# Patient Record
Sex: Male | Born: 1955 | Hispanic: Yes | Marital: Married | Smoking: Former smoker
Health system: Southern US, Community
[De-identification: ages and names within clinical notes are randomized; demographics above are authoritative.]

## PROBLEM LIST (undated history)

## (undated) DIAGNOSIS — E785 Hyperlipidemia, unspecified: Secondary | ICD-10-CM

## (undated) DIAGNOSIS — C801 Malignant (primary) neoplasm, unspecified: Secondary | ICD-10-CM

## (undated) DIAGNOSIS — R1900 Intra-abdominal and pelvic swelling, mass and lump, unspecified site: Secondary | ICD-10-CM

## (undated) DIAGNOSIS — R109 Unspecified abdominal pain: Secondary | ICD-10-CM

## (undated) DIAGNOSIS — Z8719 Personal history of other diseases of the digestive system: Secondary | ICD-10-CM

## (undated) HISTORY — PX: OTHER SURGICAL HISTORY: SHX169

## (undated) HISTORY — DX: Personal history of other diseases of the digestive system: Z87.19

## (undated) HISTORY — DX: Hyperlipidemia, unspecified: E78.5

## (undated) HISTORY — DX: Intra-abdominal and pelvic swelling, mass and lump, unspecified site: R19.00

## (undated) HISTORY — DX: Unspecified abdominal pain: R10.9

---

## 2000-03-24 ENCOUNTER — Emergency Department (HOSPITAL_COMMUNITY): Admission: EM | Admit: 2000-03-24 | Discharge: 2000-03-24 | Payer: Self-pay | Admitting: *Deleted

## 2002-10-25 ENCOUNTER — Encounter: Admission: RE | Admit: 2002-10-25 | Discharge: 2002-10-25 | Payer: Self-pay | Admitting: Internal Medicine

## 2004-06-24 ENCOUNTER — Emergency Department (HOSPITAL_COMMUNITY): Admission: EM | Admit: 2004-06-24 | Discharge: 2004-06-25 | Payer: Self-pay | Admitting: Emergency Medicine

## 2004-06-26 ENCOUNTER — Emergency Department (HOSPITAL_COMMUNITY): Admission: EM | Admit: 2004-06-26 | Discharge: 2004-06-26 | Payer: Self-pay | Admitting: Emergency Medicine

## 2008-04-24 ENCOUNTER — Inpatient Hospital Stay (HOSPITAL_COMMUNITY): Admission: EM | Admit: 2008-04-24 | Discharge: 2008-04-25 | Payer: Self-pay | Admitting: Emergency Medicine

## 2011-01-14 NOTE — Consult Note (Signed)
NAMEDALBERT, STILLINGS NO.:  [id number]   MEDICAL RECORD NO.:  [id number]          PATIENT TYPE:  INP   LOCATION:  4703                         FACILITY:  [facility]   PHYSICIAN:  Georga Hacking, M.D.DATE OF BIRTH:  December 28, 1955   DATE OF CONSULTATION:  04/24/2008  DATE OF DISCHARGE:                                 CONSULTATION   I was asked to see this 55 year old male for unassigned cardiology call.  The patient has been here 11 years ago from [place] and is probably  illegal at the present time.  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  He presented to the emergency room last evening with a prolonged episode  of chest discomfort.  He described the chest discomfort as having around  7:30 in the evening and lasting around 30 minutes.  Describes lightness  and shortness of breath associated with these symptoms.  He had a  previous episode where he felt as if he might have blacked out because  of some discomfort.  He does not have exertional type chest pain.  He is  currently pain free and has had a negative EKG and negative  cardiovascular enzymes.  He denies PND, orthopnea or edema, but does  smoke 1/2-1 pack of cigarettes per day.   PAST MEDICAL HISTORY:  Negative for hypertension, diabetes, or previous  stomach trouble.  He has a mild history of hyperlipidemia.   ALLERGIES:  None.   PAST SURGICAL HISTORY:  None.   MEDICATIONS:  None prior to admission.   SOCIAL HISTORY:  He smokes cigarettes.  He is married and has several  children.  He is evidently here legally from [place].  He does not have a  visa currently.  He says he does not use drugs to excess.   REVIEW OF SYSTEMS:  Negative except as noted above.   PHYSICAL EXAMINATION:  GENERAL:  He is a Hispanic male in no acute  distress.  VITAL SIGNS:  His blood pressure is currently 111/63, oxygen saturations  are 93%.  SKIN:  Warm and dry.  ENT:  EOMI.  PERRLA.  CNS clear.  Fundi not examined.  Pharynx  negative.  NECK:  Supple.  No masses, JVD, thyromegaly or bruits.  LUNGS:  Clear to A&P.  CARDIAC:  Normal S1-S2.  No S3 or murmur.  ABDOMEN:  Soft, nontender.  Femoral distal pulses 2+.   EKG was normal.  Two-view chest x-ray shows low volumes with basilar  atelectasis.   IMPRESSION:  1. Prolonged chest discomfort with myocardial infarction ruled out.  2. Risk factors of low HDL, cholesterol of 27, and smoking.   RECOMMENDATIONS:  Cardiolite stress testing.  He has been on beta  blockers which have been stopped.  I would stop his oxygen at this time.  Continue aspirin.      Georga Hacking, M.D.  Electronically Signed     WST/MEDQ  D:  04/24/2008  T:  04/25/2008  Job:  [id number]   cc:   Renee Ramus, MD

## 2011-01-14 NOTE — H&P (Signed)
Jesse Frye, Jesse NO.:  [id number]   MEDICAL RECORD NO.:  [id number]          PATIENT TYPE:  INP   LOCATION:  4703                         FACILITY:  [facility]   PHYSICIAN:  Della Goo, M.D. DATE OF BIRTH:  1955-12-23   DATE OF ADMISSION:  04/24/2008  DATE OF DISCHARGE:                              HISTORY & PHYSICAL   PRIMARY CARE PHYSICIAN:  Unassigned.   CHIEF COMPLAINT:  Chest pain.   HISTORY OF PRESENT ILLNESS:  This is a 55 year old male presenting to  the emergency department with complaints of left-sided chest pain, that  started at about 7:30 p.m. and lasted about 30 minutes.  The patient  reports having lightheadedness and shortness of breath associated with  his symptoms.  He denies having any nausea, vomiting or diaphoresis  associated.  The patient does have a reported history of hyperlipidemia.  He is currently on no medications at this time.  The patient denies  having any previous similar episodes to this and denies having any  family history.  The patient is a smoker and reports smoking a half-pack  of cigarettes daily.  The patient denies having any fevers, chills,  cough, chest congestion, syncope or diarrheal symptoms.   PAST MEDICAL HISTORY:  Hyperlipidemia.   MEDICATIONS:  None.   PAST SURGICAL HISTORY:  None.   ALLERGIES:  NO KNOWN DRUG ALLERGIES.   SOCIAL HISTORY:  The patient is married.  He is a smoker.  He denies  alcohol usage and denies illicit drug usage.   FAMILY HISTORY:  Negative for coronary artery disease.   REVIEW OF SYSTEMS:  Pertinents are mentioned above.   PHYSICAL EXAMINATION FINDINGS:  This is a 55 year old well-nourished,  well-developed male in no discomfort or acute distress currently.  VITAL SIGNS:  Temperature 97.9, blood pressure 149/82, heart rate 80,  respirations 18, O2 saturations 99% on room air.  HEENT:  Examination normocephalic, atraumatic.  There is no scleral  icterus.  Pupils are  equally round and reactive to light.  Extraocular  movements are intact.  Funduscopic benign.  Oropharynx clear.  NECK:  Supple; full range of motion.  No thyromegaly, adenopathy or  jugular venous distention.  CARDIOVASCULAR:  Regular rate and rhythm.  No murmurs, gallops or rubs.  LUNGS:  Clear to auscultation bilaterally.  ABDOMEN:  Positive bowel sounds; soft, nontender and nondistended.  EXTREMITIES:  Without cyanosis, clubbing or edema.  NEUROLOGIC EXAMINATION:  Nonfocal.   LABORATORY STUDIES:  Chemistry:  Sodium 138, potassium 4.0, chloride  107, bicarb 24, BUN 10, creatinine 1.0, glucose 118, ionized calcium  1.16.  Cardiac enzymes:  Myoglobin 96.9, CK-MB 3.0, troponin less than  0.05.   CHEST X-RAY FINDINGS:  Reveal low lung volumes and bibasilar  atelectasis.   EKG:  Reveals a normal sinus rhythm without acute ST-segment changes.   ASSESSMENT:  A 55 year old male being admitted with:  1. Chest pain.  2. Reported history of hyperlipidemia.  3. Tobacco abuse.   PLAN:  The patient will be admitted to a telemetry area for cardiac  monitoring.  Cardiac enzymes will be  performed.  The patient will be  placed on nitrates, oxygen, beta blocker therapy at low-dose, and DVT  and GI prophylaxis.  The patient will also be placed on a nicotine patch  while hospitalized.  Further workup will ensue, pending results of the  patient's studies and his clinical course.  A fasting lipid panel will  be ordered in the a.m.      Della Goo, M.D.  Electronically Signed     HJ/MEDQ  D:  04/24/2008  T:  04/24/2008  Job:  [id number]

## 2011-01-14 NOTE — Discharge Summary (Signed)
NAMEIDRIS, EDMUNDSON NO.:  [id number]   MEDICAL RECORD NO.:  [id number]          PATIENT TYPE:  INP   LOCATION:  4703                         FACILITY:  [facility]   PHYSICIAN:  Renee Ramus, MD       DATE OF BIRTH:  [birth year]   DATE OF ADMISSION:  04/23/2008  DATE OF DISCHARGE:  04/25/2008                               DISCHARGE SUMMARY   ADDENDUM   The patient was seen by Cardiology who felt he would benefit from a  nuclear stress test.  The patient was kept overnight.  He received his  stress test today.  His test was also completely normal with no evidence  of any type of coronary artery disease, reperfusion injury, or ischemia.  The patient is now being discharged to home.  There are no changes in  medications or additional labs.   TIME SPENT:  35 minutes.      Renee Ramus, MD  Electronically Signed     JF/MEDQ  D:  04/25/2008  T:  04/26/2008  Job:  [id number]

## 2011-01-14 NOTE — Discharge Summary (Signed)
NAMEJAEVIAN, SHEAN NO.:  [id number]   MEDICAL RECORD NO.:  [id number]           PATIENT TYPE:   LOCATION:                                 FACILITY:   PHYSICIAN:  Renee Ramus, MD       DATE OF BIRTH:  09/09/1955   DATE OF ADMISSION:  DATE OF DISCHARGE:                               DISCHARGE SUMMARY   PRIMARY DIAGNOSIS:  Chest pain.   SECONDARY DIAGNOSIS:  Hyperlipidemia.   HOSPITAL COURSE BY PROBLEM:  1. Chest pain.  The patient is a 55 year old Hispanic male who was      admitted secondary to, predominantly, shortness of breath with      lightheadedness.  The patient denies any specific pain.  He does      report a little pressure in his chest and was admitted for      further evaluation and treatment.  The patient does have history of      high cholesterol but is currently on no medications.  The patient      does have a primary care physician.  The patient's cardiac risk      factors include male, age 63, with tobacco history.  Other than      that, he has no cardiac risk factors.  The patient had negative CK      and troponins x2 along with negative EKGs and negative chest x-ray.      The patient was thought not be suffering from acute coronary      syndrome, discussed with Cardiology briefly.  The patient will be      discharged with instructions to follow up with [facility] Cardiology for      stress test if needed.  He does not require cholesterol medication      currently given his lipid profile, and we have counseled him to      quit smoking.  The patient will return to the emergency department      if his symptoms recur for admission for stress testing.  The      patient understands that we are making a best possible guess at      this diagnosis given lack of risk stratification and confirmatory      testing; however, he believes this is not cardiac as well and      wishes to be discharged.  2. Hyperlipidemia.  The patient is currently not requiring  medication      per NCEP guidelines, and he will be discharged to home with      instructions to follow up with his primary care physician as      needed.   LABORATORIES OF NOTE:  1. Troponin I x2 negative, with the first being 0.05 and the second      one being less than 0.01.  2. CK of 208 with an MB fraction of 3.4.  3. Cholesterol panel with total cholesterol 166, LDL 106, and HDL of      27.   STUDIES:  1. EKG x2 showing no acute disease.  2.  Chest x-ray showing low lung volumes but no acute disease.  3. CT of the head showing normal exam.  4. Followup chest x-ray showing no acute disease.   MEDICATIONS ON DISCHARGE:  None.   There are no labs or studies pending at the time of discharge.  The  patient is in stable condition and anxious for discharge.   TIME SPENT:  35 minutes.      Renee Ramus, MD  Electronically Signed     JF/MEDQ  D:  04/24/2008  T:  04/25/2008  Job:  [id number]

## 2013-08-15 ENCOUNTER — Encounter (HOSPITAL_COMMUNITY): Payer: Self-pay | Admitting: Emergency Medicine

## 2013-08-15 ENCOUNTER — Emergency Department (HOSPITAL_COMMUNITY)
Admission: EM | Admit: 2013-08-15 | Discharge: 2013-08-15 | Disposition: A | Discharge status: Home / Self Care | Payer: Self-pay | Attending: Emergency Medicine | Admitting: Emergency Medicine

## 2013-08-15 DIAGNOSIS — R112 Nausea with vomiting, unspecified: Secondary | ICD-10-CM | POA: Insufficient documentation

## 2013-08-15 DIAGNOSIS — Z880 Allergy status to penicillin: Secondary | ICD-10-CM | POA: Insufficient documentation

## 2013-08-15 DIAGNOSIS — F17200 Nicotine dependence, unspecified, uncomplicated: Secondary | ICD-10-CM | POA: Insufficient documentation

## 2013-08-15 DIAGNOSIS — R110 Nausea: Secondary | ICD-10-CM

## 2013-08-15 LAB — CBC WITH DIFFERENTIAL/PLATELET
Basophils Absolute: 0 10*3/uL (ref 0.0–0.1)
Basophils Relative: 1 % (ref 0–1)
Eosinophils Absolute: 0.2 10*3/uL (ref 0.0–0.7)
Eosinophils Relative: 3 % (ref 0–5)
HCT: 38.3 % — ABNORMAL LOW (ref 39.0–52.0)
Hemoglobin: 13.2 g/dL (ref 13.0–17.0)
Lymphocytes Relative: 52 % — ABNORMAL HIGH (ref 12–46)
Lymphs Abs: 3.8 10*3/uL (ref 0.7–4.0)
MCH: 32.2 pg (ref 26.0–34.0)
MCHC: 34.5 g/dL (ref 30.0–36.0)
MCV: 93.4 fL (ref 78.0–100.0)
Monocytes Absolute: 0.9 10*3/uL (ref 0.1–1.0)
Monocytes Relative: 13 % — ABNORMAL HIGH (ref 3–12)
Neutro Abs: 2.3 10*3/uL (ref 1.7–7.7)
Neutrophils Relative %: 32 % — ABNORMAL LOW (ref 43–77)
Platelets: 231 10*3/uL (ref 150–400)
RBC: 4.1 MIL/uL — ABNORMAL LOW (ref 4.22–5.81)
RDW: 13.2 % (ref 11.5–15.5)
WBC: 7.3 10*3/uL (ref 4.0–10.5)

## 2013-08-15 LAB — URINALYSIS, ROUTINE W REFLEX MICROSCOPIC
Bilirubin Urine: NEGATIVE
Glucose, UA: NEGATIVE mg/dL
Hgb urine dipstick: NEGATIVE
Ketones, ur: NEGATIVE mg/dL
Leukocytes, UA: NEGATIVE
Nitrite: NEGATIVE
Protein, ur: NEGATIVE mg/dL
Specific Gravity, Urine: 1.007 (ref 1.005–1.030)
Urobilinogen, UA: 0.2 mg/dL (ref 0.0–1.0)
pH: 6 (ref 5.0–8.0)

## 2013-08-15 LAB — COMPREHENSIVE METABOLIC PANEL
ALT: 46 U/L (ref 0–53)
AST: 30 U/L (ref 0–37)
Albumin: 3.4 g/dL — ABNORMAL LOW (ref 3.5–5.2)
Alkaline Phosphatase: 83 U/L (ref 39–117)
BUN: 13 mg/dL (ref 6–23)
CO2: 23 mEq/L (ref 19–32)
Calcium: 8.7 mg/dL (ref 8.4–10.5)
Chloride: 105 mEq/L (ref 96–112)
Creatinine, Ser: 0.91 mg/dL (ref 0.50–1.35)
GFR calc Af Amer: >90 mL/min (ref >90)
GFR calc non Af Amer: >90 mL/min (ref >90)
Glucose, Bld: 128 mg/dL — ABNORMAL HIGH (ref 70–99)
Potassium: 3.7 mEq/L (ref 3.5–5.1)
Sodium: 138 mEq/L (ref 135–145)
Total Bilirubin: 0.2 mg/dL — ABNORMAL LOW (ref 0.3–1.2)
Total Protein: 7.1 g/dL (ref 6.0–8.3)

## 2013-08-15 MED ORDER — ONDANSETRON HCL 4 MG PO TABS: 4.0000 mg | ORAL_TABLET | Freq: Four times a day (QID) | ORAL | Status: DC

## 2013-08-15 MED ORDER — ONDANSETRON HCL 4 MG/2ML IJ SOLN
4.0000 mg | Freq: Once | INTRAMUSCULAR | Status: AC
  Administered 2013-08-15: 4 mg via INTRAVENOUS
  Filled 2013-08-15: qty 2

## 2013-08-15 NOTE — ED Notes (Signed)
The pt and his wife are here as  pts with the same symptoms.  Nausea facial numbness since 1700 today.  He cannot sleep

## 2013-08-15 NOTE — ED Provider Notes (Signed)
CSN: [id number]     Arrival date & time 08/15/13  0030 History   First MD Initiated Contact with Patient 08/15/13 0043     Chief Complaint  Patient presents with  . Emesis   (Consider location/radiation/quality/duration/timing/severity/associated sxs/prior Treatment) HPI Comments: 57 yo male with cc of nausea which began 6 hrs ago.  No HA, neck pain,  f/c, CP, SOB, ABD pain, v/d, f/u/d.    Pt is allergic to PCN.    No slurred speech, facial droop, paralysis, gait or balance abnormalities or other neurologic deficit.  Pt did have fleeting nonspecific facial paresthesia which was mild and resolved.    Wife has similar symptoms.  Last meal - Tamale for lunch today.   Salmon for dinner yesterday.    Patient has gas heat. He denies exposure to carbon monoxide or other toxins. No exposure to chemicals.   He is currently taking Cipro, Flagyl, and ibuprofen which he was prescribed by his primary care provider for suspected "intestinal infection".  Patient is a 57 y.o. male presenting with vomiting. The history is provided by the patient. History limited by: Pt is Spanish speaking, but he also speaks [place].  Able to communicate w/o interpreter.   No language interpreter was used (Pt declined interpreter. ).  Emesis Severity:  Mild Duration:  6 hours Timing:  Intermittent Number of daily episodes:  No Emesis, only nausea Able to tolerate:  Liquids Progression:  Unchanged Chronicity:  New Context: not post-tussive and not self-induced   Worsened by:  Nothing tried Ineffective treatments:  None tried Associated symptoms: no abdominal pain, no arthralgias, no chills, no cough, no diarrhea, no fever, no headaches, no myalgias, no sore throat and no URI   Risk factors: no alcohol use, no diabetes, not pregnant now, no prior abdominal surgery, no sick contacts, no suspect food intake and no travel to endemic areas     History reviewed. No pertinent past medical history. History reviewed. No  pertinent past surgical history. No family history on file. History  Substance Use Topics  . Smoking status: Current Every Day Smoker  . Smokeless tobacco: Not on file  . Alcohol Use: No    Review of Systems  Constitutional: Negative.  Negative for chills.  HENT: Negative.  Negative for sore throat.   Eyes: Negative.   Respiratory: Negative.  Negative for cough and choking.   Cardiovascular: Negative.   Gastrointestinal: Positive for nausea. Negative for vomiting, abdominal pain, diarrhea, constipation, blood in stool, abdominal distention, anal bleeding and rectal pain.       Mild abdominal "fullness"  Endocrine: Negative.   Genitourinary: Negative.   Musculoskeletal: Negative.  Negative for arthralgias and myalgias.  Skin: Negative.   Neurological: Negative.  Negative for dizziness, tremors, seizures, syncope, facial asymmetry, speech difficulty, weakness, light-headedness, numbness and headaches.    Allergies  Penicillins  Home Medications  No current outpatient prescriptions on file. BP 141/81  Pulse 55  Temp(Src) 97.7 F (36.5 C)  Resp 18  Wt 180 lb (81.647 kg)  SpO2 100% Physical Exam  Nursing note and vitals reviewed. Constitutional: He is oriented to person, place, and time. He appears well-developed and well-nourished.  HENT:  Head: Normocephalic and atraumatic.  Mouth/Throat: No oropharyngeal exudate.  Eyes: Conjunctivae are normal. Pupils are equal, round, and reactive to light. Right eye exhibits no discharge. Left eye exhibits no discharge.  Neck: Normal range of motion. Neck supple.  Cardiovascular: Normal rate and regular rhythm.   Pulmonary/Chest: Effort normal and breath  sounds normal.  Abdominal: Soft. Bowel sounds are normal. He exhibits no distension and no mass. There is no tenderness. There is no rebound and no guarding. Hernia confirmed negative in the right inguinal area and confirmed negative in the left inguinal area.  Genitourinary: Penis  normal. Cremasteric reflex is present. Right testis shows no mass and no tenderness. Left testis shows no mass and no tenderness. Uncircumcised. No penile tenderness. No discharge found.  Musculoskeletal: Normal range of motion. He exhibits no edema and no tenderness.  Neurological: He is alert and oriented to person, place, and time. He has normal strength and normal reflexes. He displays no tremor. No cranial nerve deficit or sensory deficit. He exhibits normal muscle tone. He displays a negative Romberg sign. Coordination and gait normal. GCS eye subscore is 4. GCS verbal subscore is 5. GCS motor subscore is 6.  No focal deficit noted.    Skin: Skin is warm and dry.    ED Course  Procedures (including critical care time) Labs Review Labs Reviewed - No data to display Imaging Review No results found.  EKG Interpretation   None       MDM  No diagnosis found. 57 yo male presents with cc of nausea.  The patient's wife had similar symptoms. The patient is afebrile, his vital signs are normal, he is in no acute distress. His physical exam is benign to include his abdomen and a full neurologic exam.  ED course: CBC, CMP, urinalysis sent. Lab work unremarkable except for a mild elevation of lymphocytes on CBC and a mild elevation of blood glucose to 128 on CMP. Patient was provided Zofran for his nausea.  Repeat exam unremarkable.  I considered botulism, scromboid, ciguatera, stroke, TIA, SBI, surgical abdomen, UTI, carbon monoxide exposure or other toxidrome. His wife does have similar symptoms, but I do not appreciate a toxic exposure nor presence of toxidrome. There is no evidence of these or other emergent conditions. Patient should continue with his current medications. He should followup with primary care provider within one to 2 days. Strict ER precautions were given for fever, abdominal pain, vomiting, neurologic deficits, or other concerns.   Darlys Gales, MD 08/15/13 [phone]

## 2013-08-15 NOTE — ED Notes (Signed)
Language barrier he now reports some abd pain

## 2013-08-15 NOTE — ED Notes (Signed)
Pt reports he is feeling better now.

## 2013-11-11 ENCOUNTER — Encounter: Payer: Self-pay | Admitting: Internal Medicine

## 2014-01-03 ENCOUNTER — Ambulatory Visit (AMBULATORY_SURGERY_CENTER): Payer: Self-pay

## 2014-01-03 VITALS — Ht 67.0 in | Wt 190.6 lb

## 2014-01-03 DIAGNOSIS — K625 Hemorrhage of anus and rectum: Secondary | ICD-10-CM

## 2014-01-03 DIAGNOSIS — R109 Unspecified abdominal pain: Secondary | ICD-10-CM

## 2014-01-03 MED ORDER — MOVIPREP 100 G PO SOLR: ORAL | Status: DC

## 2014-01-03 NOTE — Progress Notes (Signed)
Per pt, no allergies to soy or egg products.Pt not taking any weight loss meds or using  O2 at home .Antonio (interpretor) helped explain paperwork and prep to pt. All questions were answered.

## 2014-01-17 ENCOUNTER — Encounter: Payer: Self-pay | Admitting: Internal Medicine

## 2014-01-17 ENCOUNTER — Ambulatory Visit (AMBULATORY_SURGERY_CENTER): Payer: Self-pay | Admitting: Internal Medicine

## 2014-01-17 VITALS — BP 108/45 | HR 55 | Temp 96.1°F | Resp 20 | Ht 67.0 in | Wt 190.0 lb

## 2014-01-17 DIAGNOSIS — D126 Benign neoplasm of colon, unspecified: Secondary | ICD-10-CM

## 2014-01-17 DIAGNOSIS — R109 Unspecified abdominal pain: Secondary | ICD-10-CM

## 2014-01-17 DIAGNOSIS — Z1211 Encounter for screening for malignant neoplasm of colon: Secondary | ICD-10-CM

## 2014-01-17 MED ORDER — SODIUM CHLORIDE 0.9 % IV SOLN: 500.0000 mL | INTRAVENOUS | Status: DC

## 2014-01-17 NOTE — Op Note (Signed)
[place]  [address]. [place], [id number]   COLONOSCOPY PROCEDURE REPORT  PATIENT: Jesse Frye, Jesse Frye  MR#: [id number] BIRTHDATE: 04/02/1956 , 40  yrs. old GENDER: Male ENDOSCOPIST: Jerene Bears, MD REFERRED BY: Internal Medicine, [facility]. PROCEDURE DATE:  01/17/2014 PROCEDURE:   Colonoscopy with snare polypectomy First Screening Colonoscopy - Avg.  risk and is 50 yrs.  old or older Yes.  Prior Negative Screening - Now for repeat screening. N/A  History of Adenoma - Now for follow-up colonoscopy & has been > or = to 3 yrs.  N/A  Polyps Removed Today? Yes. ASA CLASS:   Class II INDICATIONS:average risk screening and first colonoscopy. MEDICATIONS: MAC sedation, administered by CRNA and Propofol (Diprivan) 220 mg IV  DESCRIPTION OF PROCEDURE:   After the risks benefits and alternatives of the procedure were thoroughly explained, informed consent was obtained.  A digital rectal exam revealed no rectal mass.   The [id number]  endoscope was introduced through the anus and advanced to the terminal ileum which was intubated for a short distance. No adverse events experienced.   The quality of the prep was good, using MoviPrep  The instrument was then slowly withdrawn as the colon was fully examined.   COLON FINDINGS: The mucosa appeared normal in the terminal ileum. Five sessile polyps ranging between 3-31mm in size were found in the transverse colon, descending colon, and sigmoid colon.  Polypectomy was performed using cold snare.  All resections were complete and all polyp tissue was completely retrieved.   Mild diverticulosis was noted in the ascending colon, descending colon, and sigmoid colon.  Retroflexed views revealed internal/external hemorrhoids. The time to cecum=2 minutes 20 seconds.  Withdrawal time=14 minutes 02 seconds.  The scope was withdrawn and the procedure completed. COMPLICATIONS: There were no complications.  ENDOSCOPIC  IMPRESSION: 1.   Normal mucosa in the terminal ileum 2.   Five sessile polyps ranging between 3-38mm in size were found in the transverse colon, descending colon, and sigmoid colon; Polypectomy was performed using cold snare 3.   Mild diverticulosis was noted in the ascending colon, descending colon, and sigmoid colon  RECOMMENDATIONS: 1.  Await pathology results 2.  High fiber diet 3.  Timing of repeat colonoscopy will be determined by pathology findings. 4.  You will receive a letter within 1-2 weeks with the results of your biopsy as well as final recommendations.  Please call my office if you have not received a letter after 3 weeks.  eSigned:  Jerene Bears, MD 01/17/2014 9:42 AM     cc: The Patient; [facility]  PATIENT NAME:  [name], [name] MR#: [id number]

## 2014-01-17 NOTE — Patient Instructions (Signed)
YOU HAD AN ENDOSCOPIC PROCEDURE TODAY AT THE [facility]: Refer to the procedure report that was given to you for any specific questions about what was found during the examination.  If the procedure report does not answer your questions, please call your gastroenterologist to clarify.  If you requested that your care partner not be given the details of your procedure findings, then the procedure report has been included in a sealed envelope for you to review at your convenience later.  YOU SHOULD EXPECT: Some feelings of bloating in the abdomen. Passage of more gas than usual.  Walking can help get rid of the air that was put into your GI tract during the procedure and reduce the bloating. If you had a lower endoscopy (such as a colonoscopy or flexible sigmoidoscopy) you may notice spotting of blood in your stool or on the toilet paper. If you underwent a bowel prep for your procedure, then you may not have a normal bowel movement for a few days.  DIET: Your first meal following the procedure should be a light meal and then it is ok to progress to your normal diet.  A half-sandwich or bowl of soup is an example of a good first meal.  Heavy or fried foods are harder to digest and may make you feel nauseous or bloated.  Likewise meals heavy in dairy and vegetables can cause extra gas to form and this can also increase the bloating.  Drink plenty of fluids but you should avoid alcoholic beverages for 24 hours.  ACTIVITY: Your care partner should take you home directly after the procedure.  You should plan to take it easy, moving slowly for the rest of the day.  You can resume normal activity the day after the procedure however you should NOT DRIVE or use heavy machinery for 24 hours (because of the sedation medicines used during the test).    SYMPTOMS TO REPORT IMMEDIATELY: A gastroenterologist can be reached at any hour.  During normal business hours, 8:30 AM to 5:00 PM Monday through Friday,  call [phone].  After hours and on weekends, please call the GI answering service at [phone] who will take a message and have the physician on call contact you.   Following lower endoscopy (colonoscopy or flexible sigmoidoscopy):  Excessive amounts of blood in the stool  Significant tenderness or worsening of abdominal pains  Swelling of the abdomen that is new, acute  Fever of 100F or higher   FOLLOW UP: If any biopsies were taken you will be contacted by phone or by letter within the next 1-3 weeks.  Call your gastroenterologist if you have not heard about the biopsies in 3 weeks.  Our staff will call the home number listed on your records the next business day following your procedure to check on you and address any questions or concerns that you may have at that time regarding the information given to you following your procedure. This is a courtesy call and so if there is no answer at the home number and we have not heard from you through the emergency physician on call, we will assume that you have returned to your regular daily activities without incident.  SIGNATURES/CONFIDENTIALITY: You and/or your care partner have signed paperwork which will be entered into your electronic medical record.  These signatures attest to the fact that that the information above on your After Visit Summary has been reviewed and is understood.  Full responsibility of the confidentiality of   information lies with you and/or your care-partner.    Handouts were given to your care partner on polyps, diverticulosis and a high fiber diet with liberal fluid intake. You may resume your current medications today. Await biopsy results. Please call if any questions or concerns.   

## 2014-01-17 NOTE — Progress Notes (Signed)
Called to room to assist during endoscopic procedure.  Patient ID and intended procedure confirmed with present staff. Received instructions for my participation in the procedure from the performing physician.  

## 2014-01-17 NOTE — Progress Notes (Signed)
Pt was given handouts in spanish for polyps, diverticlosis and a high fiber diet.  No complaints noted in the recovery room. Pt's daughter speaks english. Daphane Shepherd interpreter for pt. Maw

## 2014-01-18 ENCOUNTER — Telehealth: Payer: Self-pay

## 2014-01-18 NOTE — Telephone Encounter (Signed)
  Follow up Call-  Call back number 01/17/2014  Post procedure Call Back phone  # [phone]  Permission to leave phone message Yes     Patient questions:  Do you have a fever, pain , or abdominal swelling? no Pain Score  0 *  Have you tolerated food without any problems? yes  Have you been able to return to your normal activities? yes  Do you have any questions about your discharge instructions: Diet   no Medications  no Follow up visit  no  Do you have questions or concerns about your Care? no  Actions: * If pain score is 4 or above: No action needed, pain <4.

## 2014-01-26 ENCOUNTER — Encounter: Payer: Self-pay | Admitting: Internal Medicine

## 2015-02-12 ENCOUNTER — Emergency Department (HOSPITAL_COMMUNITY): Payer: Self-pay

## 2015-02-12 ENCOUNTER — Encounter (HOSPITAL_COMMUNITY): Payer: Self-pay | Admitting: Emergency Medicine

## 2015-02-12 ENCOUNTER — Emergency Department (HOSPITAL_COMMUNITY)
Admission: EM | Admit: 2015-02-12 | Discharge: 2015-02-12 | Disposition: A | Discharge status: Home / Self Care | Payer: Self-pay | Attending: Emergency Medicine | Admitting: Emergency Medicine

## 2015-02-12 DIAGNOSIS — E785 Hyperlipidemia, unspecified: Secondary | ICD-10-CM | POA: Insufficient documentation

## 2015-02-12 DIAGNOSIS — Z880 Allergy status to penicillin: Secondary | ICD-10-CM | POA: Insufficient documentation

## 2015-02-12 DIAGNOSIS — Z720 Tobacco use: Secondary | ICD-10-CM | POA: Insufficient documentation

## 2015-02-12 DIAGNOSIS — R531 Weakness: Secondary | ICD-10-CM | POA: Insufficient documentation

## 2015-02-12 DIAGNOSIS — K088 Other specified disorders of teeth and supporting structures: Secondary | ICD-10-CM | POA: Insufficient documentation

## 2015-02-12 DIAGNOSIS — R51 Headache: Secondary | ICD-10-CM | POA: Insufficient documentation

## 2015-02-12 DIAGNOSIS — K0889 Other specified disorders of teeth and supporting structures: Secondary | ICD-10-CM

## 2015-02-12 DIAGNOSIS — Z79899 Other long term (current) drug therapy: Secondary | ICD-10-CM | POA: Insufficient documentation

## 2015-02-12 DIAGNOSIS — R519 Headache, unspecified: Secondary | ICD-10-CM

## 2015-02-12 LAB — CBC WITH DIFFERENTIAL/PLATELET
Basophils Absolute: 0 10*3/uL (ref 0.0–0.1)
Basophils Relative: 1 % (ref 0–1)
Eosinophils Absolute: 0.1 10*3/uL (ref 0.0–0.7)
Eosinophils Relative: 1 % (ref 0–5)
HCT: 42.3 % (ref 39.0–52.0)
Hemoglobin: 14.4 g/dL (ref 13.0–17.0)
Lymphocytes Relative: 49 % — ABNORMAL HIGH (ref 12–46)
Lymphs Abs: 2.7 10*3/uL (ref 0.7–4.0)
MCH: 31.9 pg (ref 26.0–34.0)
MCHC: 34 g/dL (ref 30.0–36.0)
MCV: 93.8 fL (ref 78.0–100.0)
Monocytes Absolute: 0.4 10*3/uL (ref 0.1–1.0)
Monocytes Relative: 7 % (ref 3–12)
Neutro Abs: 2.3 10*3/uL (ref 1.7–7.7)
Neutrophils Relative %: 42 % — ABNORMAL LOW (ref 43–77)
Platelets: 227 10*3/uL (ref 150–400)
RBC: 4.51 MIL/uL (ref 4.22–5.81)
RDW: 13.2 % (ref 11.5–15.5)
WBC: 5.5 10*3/uL (ref 4.0–10.5)

## 2015-02-12 LAB — URINALYSIS, ROUTINE W REFLEX MICROSCOPIC
BILIRUBIN URINE: NEGATIVE
Glucose, UA: NEGATIVE mg/dL
Hgb urine dipstick: NEGATIVE
Ketones, ur: NEGATIVE mg/dL
Leukocytes, UA: NEGATIVE
NITRITE: NEGATIVE
PH: 7 (ref 5.0–8.0)
Protein, ur: NEGATIVE mg/dL
Specific Gravity, Urine: 1.011 (ref 1.005–1.030)
Urobilinogen, UA: 0.2 mg/dL (ref 0.0–1.0)

## 2015-02-12 LAB — COMPREHENSIVE METABOLIC PANEL
ALBUMIN: 3.8 g/dL (ref 3.5–5.0)
ALT: 55 U/L (ref 17–63)
AST: 41 U/L (ref 15–41)
Alkaline Phosphatase: 114 U/L (ref 38–126)
Anion gap: 7 (ref 5–15)
BUN: 11 mg/dL (ref 6–20)
CO2: 23 mmol/L (ref 22–32)
CREATININE: 0.83 mg/dL (ref 0.61–1.24)
Calcium: 9 mg/dL (ref 8.9–10.3)
Chloride: 107 mmol/L (ref 101–111)
GFR calc non Af Amer: >60 mL/min (ref >60)
GLUCOSE: 98 mg/dL (ref 65–99)
Potassium: 4.1 mmol/L (ref 3.5–5.1)
Sodium: 137 mmol/L (ref 135–145)
Total Bilirubin: 0.8 mg/dL (ref 0.3–1.2)
Total Protein: 7.3 g/dL (ref 6.5–8.1)

## 2015-02-12 LAB — PROTIME-INR
INR: 1.13 (ref 0.00–1.49)
Prothrombin Time: 14.7 seconds (ref 11.6–15.2)

## 2015-02-12 LAB — I-STAT TROPONIN, ED: TROPONIN I, POC: 0 ng/mL (ref 0.00–0.08)

## 2015-02-12 MED ORDER — CLINDAMYCIN HCL 150 MG PO CAPS: 150.0000 mg | ORAL_CAPSULE | Freq: Four times a day (QID) | ORAL | Status: DC

## 2015-02-12 MED ORDER — TRAMADOL HCL 50 MG PO TABS: 50.0000 mg | ORAL_TABLET | Freq: Four times a day (QID) | ORAL | Status: DC | PRN

## 2015-02-12 NOTE — Discharge Instructions (Signed)
Dolor dental (Dental Pain) Usted ha consultado con el profesional que lo asiste porque sufre dolor en un diente. ste puede estar ocasionado en caries dentales, las que exponen el nervio del diente al aire, y a temperaturas fras o calientes, lo que Air cabin crew. Puede provenir de una infeccin o absceso (tambin denominado fornculo) alrededor del diente, que tambin suele ser causado por caries dentales. Esto produce el dolor que usted siente. DIAGNSTICO El profesional que lo asiste puede diagnosticar el problema con un examen bucal. TRATAMIENTO  Si la causa es una infeccin, puede tratarse con antibiticos (medicamentos que combaten grmenes) y Chief Strategy Officer que Scientist, research (medical), como le ha recetado el profesional que lo asiste. Tome la medicacin como se le indic.  Utilice los medicamentos de venta libre o de prescripcin para Conservation officer, historic buildings, Health and safety inspector o la [place], segn se lo indique el profesional que lo asiste.  Debido a que Scientist, research (medical) generalmente es causado por infeccin o por una enfermedad dental, es aconsejable que acuda a su dentista lo antes posible para que le realice un tratamiento ms profundo. SOLICITE ATENCIN MDICA DE INMEDIATO SI: El examen y el tratamiento que recibi hoy fue indicado slo para hacer frente a la Doctor, general practice. No constituye un sustituto de la atencin mdica o Hospital doctor. Si su problema empeora o surgen nuevos sntomas (problemas) y no puede concertar una cita con su odontlogo para un pronto seguimiento, llame o acuda nuevamente a Dietitian. SOLICITE ATENCIN MDICA DE INMEDIATO SI:  Tiene fiebre.  Presenta enrojecimiento e hinchazn en el rostro, la mandbula o el cuello.  No puede abrir Equities trader.  Siente un dolor intenso que no puede ser [employer]. EST SEGURO QUE:   Comprende las instrucciones para el alta mdica.  Controlar su enfermedad.  Solicitar atencin mdica de inmediato segn las indicaciones. Document  Released: 08/18/2005 Document Revised: 11/10/2011 [facility] Patient Information 2015 [place]. This information is not intended to replace advice given to you by your health care provider. Make sure you discuss any questions you have with your health care provider. Dizziness Dizziness is a common problem. It is a feeling of unsteadiness or light-headedness. You may feel like you are about to faint. Dizziness can lead to injury if you stumble or fall. A person of any age group can suffer from dizziness, but dizziness is more common in older adults. CAUSES  Dizziness can be caused by many different things, including:  Middle ear problems.  Standing for too long.  Infections.  An allergic reaction.  Aging.  An emotional response to something, such as the sight of blood.  Side effects of medicines.  Tiredness.  Problems with circulation or blood pressure.  Excessive use of alcohol or medicines, or illegal drug use.  Breathing too fast (hyperventilation).  An irregular heart rhythm (arrhythmia).  A low red blood cell count (anemia).  Pregnancy.  Vomiting, diarrhea, fever, or other illnesses that cause body fluid loss (dehydration).  Diseases or conditions such as Parkinson's disease, high blood pressure (hypertension), diabetes, and thyroid problems.  Exposure to extreme heat. DIAGNOSIS  Your health care provider will ask about your symptoms, perform a physical exam, and perform an electrocardiogram (ECG) to record the electrical activity of your heart. Your health care provider may also perform other heart or blood tests to determine the cause of your dizziness. These may include:  Transthoracic echocardiogram (TTE). During echocardiography, sound waves are used to evaluate how blood flows through your heart.  Transesophageal echocardiogram (  TEE).  Cardiac monitoring. This allows your health care provider to monitor your heart rate and rhythm in real time.  Holter  monitor. This is a portable device that records your heartbeat and can help diagnose heart arrhythmias. It allows your health care provider to track your heart activity for several days if needed.  Stress tests by exercise or by giving medicine that makes the heart beat faster. TREATMENT  Treatment of dizziness depends on the cause of your symptoms and can vary greatly. HOME CARE INSTRUCTIONS   Drink enough fluids to keep your urine clear or pale yellow. This is especially important in very hot weather. In older adults, it is also important in cold weather.  Take your medicine exactly as directed if your dizziness is caused by medicines. When taking blood pressure medicines, it is especially important to get up slowly.  Rise slowly from chairs and steady yourself until you feel okay.  In the morning, first sit up on the side of the bed. When you feel okay, stand slowly while holding onto something until you know your balance is fine.  Move your legs often if you need to stand in one place for a long time. Tighten and relax your muscles in your legs while standing.  Have someone stay with you for 1-2 days if dizziness continues to be a problem. Do this until you feel you are well enough to stay alone. Have the person call your health care provider if he or she notices changes in you that are concerning.  Do not drive or use heavy machinery if you feel dizzy.  Do not drink alcohol. SEEK IMMEDIATE MEDICAL CARE IF:   Your dizziness or light-headedness gets worse.  You feel nauseous or vomit.  You have problems talking, walking, or using your arms, hands, or legs.  You feel weak.  You are not thinking clearly or you have trouble forming sentences. It may take a friend or family member to notice this.  You have chest pain, abdominal pain, shortness of breath, or sweating.  Your vision changes.  You notice any bleeding.  You have side effects from medicine that seems to be getting  worse rather than better. MAKE SURE YOU:   Understand these instructions.  Will watch your condition.  Will get help right away if you are not doing well or get worse. Document Released: 02/11/2001 Document Revised: 08/23/2013 Document Reviewed: 03/07/2011 [facility] Patient Information 2015 [place], [place]. This information is not intended to replace advice given to you by your health care provider. Make sure you discuss any questions you have with your health care provider.

## 2015-02-12 NOTE — ED Notes (Signed)
Pt. Stated, yesterday I've had numbness in my left arm and pressure across my head.  I also had some cold sweat yesterday.

## 2015-02-12 NOTE — ED Provider Notes (Signed)
CSN: [id number]     Arrival date & time 02/12/15  6270 History   First MD Initiated Contact with Patient 02/12/15 0950     Chief Complaint  Patient presents with  . Extremity Weakness  . Headache     (Consider location/radiation/quality/duration/timing/severity/associated sxs/prior Treatment) HPI    PCP: PROVIDER NOT IN SYSTEM Blood pressure 133/75, pulse 55, temperature 97.8 F (36.6 C), temperature source Oral, resp. rate 16, height 5\' 7"  (1.702 m), weight 190 lb (86.183 kg), SpO2 96 %.  Jesse Frye is a 59 y.o.male with a significant PMH abdominal pain, hx of rectal bleeding, hyperlipidemia, abdominal swelling presents to the ER with complaints of intermittent headaches that are located at the left occipital region and feel like pressure as well the sensation of bugs crawling on his left arm and left upper dental pain.    He currently denies having any symptoms. He reports the symptoms have been "off and on" for a while, he is not sure if it is possibly a year since his tooth was removed. When he wakes up in the morning he feels as though he walks sideways for a few minutes every morning but then it quickly improves. He has been having sinus pains to the right sinus cavity as well as left upper dental pain.    The patient denies having any symptoms of nausea, vomiting, diarrhea, palpitations, syncope, convulsing, change in vision, dysphagia, aphagia, abnormal bleeding, neck pain- denies ever having focal or generalized weakness.  Pt reports he does not currently have any of these symptoms.    Past Medical History  Diagnosis Date  . Abdominal pain on and off for 2 months  . History of rectal bleeding     for 1 week  . Hyperlipidemia     no meds  . Abdominal swelling    Past Surgical History  Procedure Laterality Date  . Rectal fistula    . Broken arm      right arm   Family History  Problem Relation Age of Onset  . Esophageal cancer Father    History   Substance Use Topics  . Smoking status: Current Every Day Smoker    Types: Cigarettes  . Smokeless tobacco: Never Used     Comment: Pt smokes 4-6 cigarettes day.  . Alcohol Use: No    Review of Systems 10 Systems reviewed and are negative for acute change except as noted in the HPI.      Allergies  Penicillins  Home Medications   Prior to Admission medications   Medication Sig Start Date End Date Taking? Authorizing Provider  Multiple Vitamins-Minerals (MULTIVITAMIN & MINERAL PO) Take 1 tablet by mouth daily.   Yes Historical Provider, MD  Omega-3 Fatty Acids (FISH OIL PO) Take 1 tablet by mouth daily.   Yes Historical Provider, MD  clindamycin (CLEOCIN) 150 MG capsule Take 1 capsule (150 mg total) by mouth every 6 (six) hours. 02/12/15   Yina Riviere Carlota Raspberry, PA-C  traMADol (ULTRAM) 50 MG tablet Take 1 tablet (50 mg total) by mouth every 6 (six) hours as needed. 02/12/15   Eudelia Hiltunen Carlota Raspberry, PA-C   BP 126/84 mmHg  Pulse 51  Temp(Src) 97.7 F (36.5 C) (Oral)  Resp 16  Ht 5\' 7"  (1.702 m)  Wt 190 lb (86.183 kg)  BMI 29.75 kg/m2  SpO2 96%   Physical Exam  Constitutional: He is oriented to person, place, and time. He appears well-developed and well-nourished. No distress.  HENT:  Head: Normocephalic and atraumatic.  Head is without Battle's sign, without abrasion and without contusion.  Right Ear: Tympanic membrane normal.  Left Ear: Tympanic membrane normal.  Nose: Nose normal.  Mouth/Throat: Uvula is midline and oropharynx is clear and moist.  Eyes: Pupils are equal, round, and reactive to light.  Neck: Normal range of motion. Neck supple. No spinous process tenderness and no muscular tenderness present.  No TTP of occipital scalp or wounds/rash noted.  Cardiovascular: Normal rate and regular rhythm.   Pulmonary/Chest: Effort normal and breath sounds normal. He has no decreased breath sounds. He has no wheezes. He exhibits no laceration, no crepitus and no retraction.  Abdominal:  Soft. Bowel sounds are normal. There is no tenderness.  Neurological: He is alert and oriented to person, place, and time. He has normal strength. No cranial nerve deficit or sensory deficit.  Skin: Skin is warm and dry. No rash noted.  Psychiatric: His mood appears not anxious. He does not exhibit a depressed mood.  Nursing note and vitals reviewed.      ED Course  Procedures (including critical care time) Labs Review Labs Reviewed  CBC WITH DIFFERENTIAL/PLATELET - Abnormal; Notable for the following:    Neutrophils Relative % 42 (*)    Lymphocytes Relative 49 (*)    All other components within normal limits  COMPREHENSIVE METABOLIC PANEL  PROTIME-INR  URINALYSIS, ROUTINE W REFLEX MICROSCOPIC (NOT AT [facility])  Randolm Idol, ED  Randolm Idol, ED    Imaging Review Dg Chest 2 View  02/12/2015   CLINICAL DATA:  Headache, weakness, no chest pain  EXAM: CHEST  2 VIEW  COMPARISON:  04/23/2008  FINDINGS: Cardiomediastinal silhouette is unremarkable. No acute infiltrate or pleural effusion. No pulmonary edema. Stable degenerative changes thoracic spine. Stable minimal compression deformity mid thoracic spine.  IMPRESSION: No active cardiopulmonary disease.  No significant change.   Electronically Signed   By: Lahoma Crocker M.D.   On: 02/12/2015 13:31   Ct Head Wo Contrast  02/12/2015   CLINICAL DATA:  Left arm tingling, dizziness and headache. Symptoms began yesterday.  EXAM: CT HEAD WITHOUT CONTRAST  TECHNIQUE: Contiguous axial images were obtained from the base of the skull through the vertex without intravenous contrast.  COMPARISON:  06/26/2004.  FINDINGS: No evidence of an acute infarct, acute hemorrhage, mass lesion, mass effect or hydrocephalus. Visualized portions of the paranasal sinuses and mastoid air cells are clear.  IMPRESSION: Negative.   Electronically Signed   By: Lorin Picket M.D.   On: 02/12/2015 12:12     EKG Interpretation None      MDM   Final diagnoses:   Headache  Pain, dental    Patients symptoms have been on and off for 1 year, he decided to have them evaluated today because he reports being concerned about possible sinus or dental infection. He had a thorough work-up completed which included CT head (normal), chest xray (normal), negative Troponin, UA, CBC, CMP and pt/inr.   I question if the patient is having atypical headaches, dental pain, vertigo, or peripheral neuropathy. The patient does not have a PCP and therefore has been given referral to [facility]. Will treat with pain medication and Clindamycin for the dental pain. Advised to f/u with his dentist.  Patient had no symptoms while in the ED and was discharged in stable condition.  Medications - No data to display  59 y.o.Lindell Renfrew Absher's evaluation in the Emergency Department is complete. It has been determined that no acute conditions requiring further  emergency intervention are present at this time. The patient/guardian have been advised of the diagnosis and plan. We have discussed signs and symptoms that warrant return to the ED, such as changes or worsening in symptoms.  Vital signs are stable at discharge. Filed Vitals:   02/12/15 1413  BP: 126/84  Pulse: 51  Temp: 97.7 F (36.5 C)  Resp: 16    Patient/guardian has voiced understanding and agreed to follow-up with the PCP or specialist.     Delos Haring, PA-C 02/12/15 [place], MD 02/12/15 1546

## 2015-08-18 ENCOUNTER — Encounter (HOSPITAL_COMMUNITY): Payer: Self-pay | Admitting: Emergency Medicine

## 2015-08-18 ENCOUNTER — Emergency Department (HOSPITAL_COMMUNITY): Payer: Self-pay

## 2015-08-18 ENCOUNTER — Emergency Department (HOSPITAL_COMMUNITY)
Admission: EM | Admit: 2015-08-18 | Discharge: 2015-08-19 | Disposition: A | Discharge status: Home / Self Care | Payer: Self-pay | Attending: Emergency Medicine | Admitting: Emergency Medicine

## 2015-08-18 DIAGNOSIS — Z792 Long term (current) use of antibiotics: Secondary | ICD-10-CM | POA: Insufficient documentation

## 2015-08-18 DIAGNOSIS — E785 Hyperlipidemia, unspecified: Secondary | ICD-10-CM | POA: Insufficient documentation

## 2015-08-18 DIAGNOSIS — Z79899 Other long term (current) drug therapy: Secondary | ICD-10-CM | POA: Insufficient documentation

## 2015-08-18 DIAGNOSIS — Z8719 Personal history of other diseases of the digestive system: Secondary | ICD-10-CM | POA: Insufficient documentation

## 2015-08-18 DIAGNOSIS — F17210 Nicotine dependence, cigarettes, uncomplicated: Secondary | ICD-10-CM | POA: Insufficient documentation

## 2015-08-18 DIAGNOSIS — R200 Anesthesia of skin: Secondary | ICD-10-CM | POA: Insufficient documentation

## 2015-08-18 DIAGNOSIS — J0100 Acute maxillary sinusitis, unspecified: Secondary | ICD-10-CM | POA: Insufficient documentation

## 2015-08-18 DIAGNOSIS — Z880 Allergy status to penicillin: Secondary | ICD-10-CM | POA: Insufficient documentation

## 2015-08-18 DIAGNOSIS — Z791 Long term (current) use of non-steroidal anti-inflammatories (NSAID): Secondary | ICD-10-CM | POA: Insufficient documentation

## 2015-08-18 LAB — CBC WITH DIFFERENTIAL/PLATELET
BASOS ABS: 0 10*3/uL (ref 0.0–0.1)
BASOS PCT: 1 %
Eosinophils Absolute: 0.2 10*3/uL (ref 0.0–0.7)
Eosinophils Relative: 2 %
HEMATOCRIT: 42.5 % (ref 39.0–52.0)
Hemoglobin: 14.3 g/dL (ref 13.0–17.0)
Lymphocytes Relative: 37 %
Lymphs Abs: 3 10*3/uL (ref 0.7–4.0)
MCH: 31.9 pg (ref 26.0–34.0)
MCHC: 33.6 g/dL (ref 30.0–36.0)
MCV: 94.9 fL (ref 78.0–100.0)
MONO ABS: 0.5 10*3/uL (ref 0.1–1.0)
Monocytes Relative: 6 %
NEUTROS ABS: 4.5 10*3/uL (ref 1.7–7.7)
Neutrophils Relative %: 54 %
PLATELETS: 302 10*3/uL (ref 150–400)
RBC: 4.48 MIL/uL (ref 4.22–5.81)
RDW: 13.2 % (ref 11.5–15.5)
WBC: 8.2 10*3/uL (ref 4.0–10.5)

## 2015-08-18 LAB — COMPREHENSIVE METABOLIC PANEL
ALBUMIN: 3.6 g/dL (ref 3.5–5.0)
ALT: 41 U/L (ref 17–63)
ANION GAP: 9 (ref 5–15)
AST: 33 U/L (ref 15–41)
Alkaline Phosphatase: 99 U/L (ref 38–126)
BUN: 11 mg/dL (ref 6–20)
CHLORIDE: 104 mmol/L (ref 101–111)
CO2: 24 mmol/L (ref 22–32)
CREATININE: 0.93 mg/dL (ref 0.61–1.24)
Calcium: 9 mg/dL (ref 8.9–10.3)
GFR calc non Af Amer: >60 mL/min (ref >60)
Glucose, Bld: 257 mg/dL — ABNORMAL HIGH (ref 65–99)
Potassium: 3.8 mmol/L (ref 3.5–5.1)
Sodium: 137 mmol/L (ref 135–145)
Total Bilirubin: 0.7 mg/dL (ref 0.3–1.2)
Total Protein: 7.7 g/dL (ref 6.5–8.1)

## 2015-08-18 LAB — I-STAT TROPONIN, ED: TROPONIN I, POC: 0 ng/mL (ref 0.00–0.08)

## 2015-08-18 NOTE — ED Notes (Addendum)
Pt st's he has had numbness in left shoulder, left arm, left buttock and left leg off and on x's 1 year.  Pt st's he had a dental procedure 1 1/2 weeks ago and since then the numbness has gotton worse.  Pt alert and oriented x's 3.  Pt also c/o heart beating too fast and feeling tired.  Neuro exam neg in triage

## 2015-08-19 NOTE — Discharge Instructions (Signed)
If you were given medicines take as directed.  If you are on coumadin or contraceptives realize their levels and effectiveness is altered by many different medicines.  If you have any reaction (rash, tongues swelling, other) to the medicines stop taking and see a physician.    If your blood pressure was elevated in the ER make sure you follow up for management with a primary doctor or return for chest pain, shortness of breath or stroke symptoms.  Please follow up as directed and return to the ER or see a physician for new or worsening symptoms.  Thank you. Filed Vitals:   08/18/15 2345 08/19/15 0000 08/19/15 0015 08/19/15 0030  BP: 131/85 128/89 126/88 110/68  Pulse: 79 69 71 67  Temp:      Resp:      Weight:      SpO2: 98% 96% 98% 96%

## 2015-08-19 NOTE — ED Provider Notes (Addendum)
CSN: [id number]     Arrival date & time 08/18/15  1908 History   First MD Initiated Contact with Patient 08/18/15 2051     Chief Complaint  Patient presents with  . Numbness     (Consider location/radiation/quality/duration/timing/severity/associated sxs/prior Treatment) HPI Comments: 59 year old male with history of recurrent dental infections not currently on antibiotics no significant pain currently presents with left-sided numbness. Worse for the past 3 days. Patient is had intermittent in the past no stroke history. No focal weakness however his left arm just doesn't feel the same as his right. No headache no neck stiffness. Symptoms mild.  The history is provided by the patient.    Past Medical History  Diagnosis Date  . Abdominal pain on and off for 2 months  . History of rectal bleeding     for 1 week  . Hyperlipidemia     no meds  . Abdominal swelling    Past Surgical History  Procedure Laterality Date  . Rectal fistula    . Broken arm      right arm   Family History  Problem Relation Age of Onset  . Esophageal cancer Father    Social History  Substance Use Topics  . Smoking status: Current Every Day Smoker    Types: Cigarettes  . Smokeless tobacco: Never Used     Comment: Pt smokes 4-6 cigarettes day.  . Alcohol Use: No    Review of Systems  Constitutional: Negative for fever and chills.  HENT: Negative for congestion.   Eyes: Negative for visual disturbance.  Respiratory: Negative for shortness of breath.   Cardiovascular: Negative for chest pain.  Gastrointestinal: Negative for vomiting and abdominal pain.  Genitourinary: Negative for dysuria and flank pain.  Musculoskeletal: Negative for back pain, neck pain and neck stiffness.  Skin: Negative for rash.  Neurological: Positive for numbness. Negative for light-headedness and headaches.      Allergies  Penicillins  Home Medications   Prior to Admission medications   Medication Sig Start Date  End Date Taking? Authorizing Provider  Omega-3 Fatty Acids (FISH OIL PO) Take 1 tablet by mouth daily.   Yes Historical Provider, MD  clindamycin (CLEOCIN) 150 MG capsule Take 1 capsule (150 mg total) by mouth every 6 (six) hours. 02/12/15   Tiffany Carlota Raspberry, PA-C  traMADol (ULTRAM) 50 MG tablet Take 1 tablet (50 mg total) by mouth every 6 (six) hours as needed. 02/12/15   Tiffany Carlota Raspberry, PA-C   BP 121/79 mmHg  Pulse 73  Temp(Src) 98.1 F (36.7 C)  Resp 20  Wt 187 lb (84.823 kg)  SpO2 96% Physical Exam  Constitutional: He is oriented to person, place, and time. He appears well-developed and well-nourished.  HENT:  Head: Normocephalic and atraumatic.  Eyes: Conjunctivae are normal. Right eye exhibits no discharge. Left eye exhibits no discharge.  Neck: Normal range of motion. Neck supple. No tracheal deviation present.  Cardiovascular: Normal rate and regular rhythm.   Pulmonary/Chest: Effort normal and breath sounds normal.  Abdominal: Soft. He exhibits no distension. There is no tenderness. There is no guarding.  Musculoskeletal: He exhibits no edema.  Neurological: He is alert and oriented to person, place, and time. GCS eye subscore is 4. GCS verbal subscore is 5. GCS motor subscore is 6.  5+ strength in UE and LE with f/e at major joints. Sensation to palpation intact in UE and LE. CNs 2-12 grossly intact.  EOMFI.  PERRL.   Finger nose and coordination intact bilateral.  Visual fields intact to finger testing. No nystagmus Subjective decreased sensation left arm versus right.  Skin: Skin is warm. No rash noted.  Psychiatric: He has a normal mood and affect.  Nursing note and vitals reviewed.   ED Course  Procedures (including critical care time) Labs Review Labs Reviewed  COMPREHENSIVE METABOLIC PANEL - Abnormal; Notable for the following:    Glucose, Bld 257 (*)    All other components within normal limits  CBC WITH DIFFERENTIAL/PLATELET  Randolm Idol, ED     Imaging Review Mr Brain Wo Contrast  08/19/2015  CLINICAL DATA:  Initial evaluation for intermittent headache. EXAM: MRI HEAD WITHOUT CONTRAST TECHNIQUE: Multiplanar, multiecho pulse sequences of the brain and surrounding structures were obtained without intravenous contrast. COMPARISON:  Prior CT from 02/12/2015. FINDINGS: Cerebral volume within normal limits for patient age. Few tiny T2/FLAIR hyperintense foci present within the deep and subcortical white matter both cerebral hemispheres, nonspecific, but most likely related to chronic small vessel ischemic disease, also felt to be within normal limits for patient age. No abnormal foci of restricted diffusion to suggest acute intracranial infarct. Gray-white matter differentiation maintained. Normal intravascular flow voids are maintained. No acute or chronic intracranial hemorrhage. No mass lesion, midline shift or mass effect. No hydrocephalus. No extra-axial fluid collection. Craniocervical junction within normal limits. Visualized upper cervical spine within normal limits. No spinal stenosis. Pituitary gland normal. Tiny cyst noted within the pineal gland. No acute abnormality about the orbits. Moderate mucosal thickening within the left maxillary sinus. Scattered opacity within the left ethmoidal air cells. Air-fluid level within the left frontal sinus. Right paranasal sinuses and sphenoid sinuses are clear. Small amount of opacity within the right mastoid air cells, of doubtful clinical significance. Left mastoid air cells are clear. Inner ear structures grossly within normal limits. Bone marrow signal intensity within normal limits. No scalp soft tissue abnormality. IMPRESSION: 1. Negative brain MRI with no acute intracranial process identified. 2. Left-sided paranasal sinus disease as above. Air-fluid level within the left frontal sinus suggest acute sinusitis. Electronically Signed   By: Jeannine Boga M.D.   On: 08/19/2015 00:42   I have  personally reviewed and evaluated these images and lab results as part of my medical decision-making.   EKG Interpretation   Date/Time:  Saturday August 18 2015 19:46:43 EST Ventricular Rate:  81 PR Interval:  160 QRS Duration: 100 QT Interval:  392 QTC Calculation: 455 R Axis:   83 Text Interpretation:  Normal sinus rhythm Normal ECG Confirmed by Margaree Sandhu   MD, Doshie Maggi ([id number]) on 08/18/2015 9:20:40 PM      MDM   Final diagnoses:  Left arm numbness  Acute maxillary sinusitis, recurrence not specified   Patient presents with worsening left arm greater than left leg numbness. With unilateral concerns and recurrent issue MRI ordered for further delineation. Patient has a history of dental infections, no fever does not appear infectious at this time. MRI results reviewed no acute findings. Patient stable for outpatient follow-up  Results and differential diagnosis were discussed with the patient/parent/guardian. Xrays were independently reviewed by myself.  Close follow up outpatient was discussed, comfortable with the plan.   Medications - No data to display  Filed Vitals:   08/19/15 0000 08/19/15 0015 08/19/15 0030 08/19/15 0045  BP: 128/89 126/88 110/68 121/79  Pulse: 69 71 67 73  Temp:      Resp:      Weight:      SpO2: 96% 98% 96% 96%  Final diagnoses:  Left arm numbness  Acute maxillary sinusitis, recurrence not specified        Elnora Morrison, MD 08/19/15 [id number]  Elnora Morrison, MD 08/19/15 [id number]

## 2018-11-08 ENCOUNTER — Encounter: Payer: Self-pay | Admitting: *Deleted

## 2019-01-17 ENCOUNTER — Encounter: Payer: Self-pay | Admitting: Internal Medicine

## 2019-05-13 ENCOUNTER — Other Ambulatory Visit: Payer: Self-pay | Admitting: Otolaryngology

## 2019-05-13 DIAGNOSIS — J329 Chronic sinusitis, unspecified: Secondary | ICD-10-CM

## 2019-05-24 ENCOUNTER — Ambulatory Visit
Admission: RE | Admit: 2019-05-24 | Discharge: 2019-05-24 | Disposition: A | Discharge status: Home / Self Care | Payer: No Typology Code available for payment source | Source: Ambulatory Visit | Attending: Otolaryngology | Admitting: Otolaryngology

## 2019-05-24 DIAGNOSIS — J329 Chronic sinusitis, unspecified: Secondary | ICD-10-CM

## 2019-06-23 ENCOUNTER — Other Ambulatory Visit: Payer: Self-pay

## 2019-06-23 DIAGNOSIS — Z20822 Contact with and (suspected) exposure to covid-19: Secondary | ICD-10-CM

## 2019-06-25 LAB — NOVEL CORONAVIRUS, NAA: SARS-CoV-2, NAA: NOT DETECTED

## 2020-08-21 ENCOUNTER — Encounter (HOSPITAL_COMMUNITY): Payer: Self-pay | Admitting: Radiology

## 2020-08-21 ENCOUNTER — Encounter (HOSPITAL_COMMUNITY): Payer: Self-pay | Admitting: Emergency Medicine

## 2020-08-21 ENCOUNTER — Other Ambulatory Visit: Payer: Self-pay

## 2020-08-21 ENCOUNTER — Emergency Department (HOSPITAL_COMMUNITY)
Admission: EM | Admit: 2020-08-21 | Discharge: 2020-08-21 | Disposition: A | Discharge status: Home / Self Care | Payer: Self-pay | Attending: Emergency Medicine | Admitting: Emergency Medicine

## 2020-08-21 ENCOUNTER — Other Ambulatory Visit: Payer: Self-pay | Admitting: Oncology

## 2020-08-21 ENCOUNTER — Telehealth: Payer: Self-pay | Admitting: Oncology

## 2020-08-21 ENCOUNTER — Emergency Department (HOSPITAL_COMMUNITY): Payer: Self-pay

## 2020-08-21 DIAGNOSIS — K8689 Other specified diseases of pancreas: Secondary | ICD-10-CM

## 2020-08-21 DIAGNOSIS — F17210 Nicotine dependence, cigarettes, uncomplicated: Secondary | ICD-10-CM | POA: Insufficient documentation

## 2020-08-21 DIAGNOSIS — K869 Disease of pancreas, unspecified: Secondary | ICD-10-CM | POA: Insufficient documentation

## 2020-08-21 DIAGNOSIS — I700 Atherosclerosis of aorta: Secondary | ICD-10-CM | POA: Insufficient documentation

## 2020-08-21 LAB — CBC WITH DIFFERENTIAL/PLATELET
Abs Immature Granulocytes: 0.03 10*3/uL (ref 0.00–0.07)
Basophils Absolute: 0.1 10*3/uL (ref 0.0–0.1)
Basophils Relative: 1 %
Eosinophils Absolute: 0.3 10*3/uL (ref 0.0–0.5)
Eosinophils Relative: 4 %
HCT: 41.5 % (ref 39.0–52.0)
Hemoglobin: 13.1 g/dL (ref 13.0–17.0)
Immature Granulocytes: 0 %
Lymphocytes Relative: 26 %
Lymphs Abs: 1.9 10*3/uL (ref 0.7–4.0)
MCH: 30.2 pg (ref 26.0–34.0)
MCHC: 31.6 g/dL (ref 30.0–36.0)
MCV: 95.6 fL (ref 80.0–100.0)
Monocytes Absolute: 0.7 10*3/uL (ref 0.1–1.0)
Monocytes Relative: 10 %
Neutro Abs: 4.4 10*3/uL (ref 1.7–7.7)
Neutrophils Relative %: 59 %
Platelets: 271 10*3/uL (ref 150–400)
RBC: 4.34 MIL/uL (ref 4.22–5.81)
RDW: 12.8 % (ref 11.5–15.5)
WBC: 7.4 10*3/uL (ref 4.0–10.5)
nRBC: 0 % (ref 0.0–0.2)

## 2020-08-21 LAB — URINALYSIS, ROUTINE W REFLEX MICROSCOPIC
Bilirubin Urine: NEGATIVE
Glucose, UA: NEGATIVE mg/dL
Hgb urine dipstick: NEGATIVE
Ketones, ur: NEGATIVE mg/dL
Nitrite: NEGATIVE
Protein, ur: NEGATIVE mg/dL
Specific Gravity, Urine: 1.013 (ref 1.005–1.030)
pH: 5 (ref 5.0–8.0)

## 2020-08-21 LAB — BASIC METABOLIC PANEL
Anion gap: 11 (ref 5–15)
BUN: 15 mg/dL (ref 8–23)
CO2: 24 mmol/L (ref 22–32)
Calcium: 8.8 mg/dL — ABNORMAL LOW (ref 8.9–10.3)
Chloride: 101 mmol/L (ref 98–111)
Creatinine, Ser: 0.88 mg/dL (ref 0.61–1.24)
GFR, Estimated: >60 mL/min (ref >60)
Glucose, Bld: 126 mg/dL — ABNORMAL HIGH (ref 70–99)
Potassium: 3.6 mmol/L (ref 3.5–5.1)
Sodium: 136 mmol/L (ref 135–145)

## 2020-08-21 LAB — LIPASE, BLOOD: Lipase: 26 U/L (ref 11–51)

## 2020-08-21 LAB — LACTIC ACID, PLASMA: Lactic Acid, Venous: 1.3 mmol/L (ref 0.5–1.9)

## 2020-08-21 MED ORDER — IOHEXOL 300 MG/ML  SOLN
100.0000 mL | Freq: Once | INTRAMUSCULAR | Status: AC | PRN
  Administered 2020-08-21: 100 mL via INTRAVENOUS

## 2020-08-21 MED ORDER — KETOROLAC TROMETHAMINE 15 MG/ML IJ SOLN
15.0000 mg | Freq: Once | INTRAMUSCULAR | Status: AC
Start: 2020-08-21 — End: 2020-08-21
  Administered 2020-08-21: 15 mg via INTRAVENOUS
  Filled 2020-08-21: qty 1

## 2020-08-21 NOTE — Telephone Encounter (Signed)
[name] has been cld and scheduled to see Dr. 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 Pt aware to arrive 30 minutes early.

## 2020-08-21 NOTE — ED Provider Notes (Signed)
[facility] EMERGENCY DEPARTMENT Provider Note   CSN: [id number] Arrival date & time: 08/21/20  0277     History Chief Complaint  Patient presents with  . Abdominal Pain    Jesse Frye is a 64 y.o. male.  Patient presents with lower abdominal pain x1 week.  Described as achy and crampy.  Waxes and wanes in nature.  Nothing seems to make it better.  He went to see his primary care doctor who gave him MiraLAX but he states it has not been helping.  Denies fevers or cough.  No vomiting or diarrhea.          Past Medical History:  Diagnosis Date  . Abdominal pain on and off for 2 months  . Abdominal swelling   . History of rectal bleeding    for 1 week  . Hyperlipidemia    no meds    There are no problems to display for this patient.   Past Surgical History:  Procedure Laterality Date  . broken arm     right arm  . rectal fistula         Family History  Problem Relation Age of Onset  . Esophageal cancer Father     Social History   Tobacco Use  . Smoking status: Current Every Day Smoker    Types: Cigarettes  . Smokeless tobacco: Never Used  . Tobacco comment: Pt smokes 4-6 cigarettes day.  Substance Use Topics  . Alcohol use: No  . Drug use: No    Home Medications Prior to Admission medications   Medication Sig Start Date End Date Taking? Authorizing Provider  clindamycin (CLEOCIN) 150 MG capsule Take 1 capsule (150 mg total) by mouth every 6 (six) hours. 02/12/15   Delos Haring, PA-C  Omega-3 Fatty Acids (FISH OIL PO) Take 1 tablet by mouth daily.    [provider]  traMADol (ULTRAM) 50 MG tablet Take 1 tablet (50 mg total) by mouth every 6 (six) hours as needed. 02/12/15   Delos Haring, PA-C    Allergies    Penicillins  Review of Systems   Review of Systems  Constitutional: Negative for fever.  HENT: Negative for ear pain and sore throat.   Eyes: Negative for pain.  Respiratory: Negative for cough.    Cardiovascular: Negative for chest pain.  Gastrointestinal: Positive for abdominal pain.  Genitourinary: Negative for flank pain.  Musculoskeletal: Negative for back pain.  Skin: Negative for color change and rash.  Neurological: Negative for syncope.  All other systems reviewed and are negative.   Physical Exam Updated Vital Signs BP 117/78 (BP Location: Right Arm)   Pulse (!) 51   Temp 98.3 F (36.8 C) (Oral)   Resp 15   SpO2 99%   Physical Exam Constitutional:      General: He is not in acute distress.    Appearance: He is well-developed.  HENT:     Head: Normocephalic.     Mouth/Throat:     Mouth: Mucous membranes are moist.  Cardiovascular:     Rate and Rhythm: Normal rate.  Pulmonary:     Effort: Pulmonary effort is normal.  Abdominal:     Palpations: Abdomen is soft.     Tenderness: There is abdominal tenderness in the suprapubic area.  Musculoskeletal:     Right lower leg: No edema.     Left lower leg: No edema.  Skin:    General: Skin is warm.     Capillary Refill:  Capillary refill takes less than 2 seconds.  Neurological:     General: No focal deficit present.     Mental Status: He is alert.     ED Results / Procedures / Treatments   Labs (all labs ordered are listed, but only abnormal results are displayed) Labs Reviewed  BASIC METABOLIC PANEL - Abnormal; Notable for the following components:      Result Value   Glucose, Bld 126 (*)    Calcium 8.8 (*)    All other components within normal limits  URINALYSIS, ROUTINE W REFLEX MICROSCOPIC - Abnormal; Notable for the following components:   Leukocytes,Ua TRACE (*)    Bacteria, UA RARE (*)    All other components within normal limits  CBC WITH DIFFERENTIAL/PLATELET  LIPASE, BLOOD  LACTIC ACID, PLASMA  LACTIC ACID, PLASMA    EKG None  Radiology CT Abdomen Pelvis W Contrast  Addendum Date: 08/21/2020   ADDENDUM REPORT: 08/21/2020 12:12 ADDENDUM: Findings were discussed via telephone at the  time of dictation given SMV narrowing suspected superimposed acute thrombus lactate was suggested for correlation to exclude developing venous ischemia though bowel enhancement is preserved and symmetric on today's study. Origin of suspected neoplasm may be upper gastrointestinal versus pancreatic based on the appearance of the gastric antrum which is also inseparable from soft tissue in the lesser sac. These results were called by telephone at the time of interpretation on 08/21/2020 at 12:12 pm to provider [name] , who verbally acknowledged these results. Electronically Signed   By: Zetta Bills M.D.   On: 08/21/2020 12:12   Result Date: 08/21/2020 CLINICAL DATA:  Acute abdominal pain for 8 days, diffuse abdominal pain EXAM: CT ABDOMEN AND PELVIS WITH CONTRAST TECHNIQUE: Multidetector CT imaging of the abdomen and pelvis was performed using the standard protocol following bolus administration of intravenous contrast. CONTRAST:  182mL OMNIPAQUE IOHEXOL 300 MG/ML  SOLN COMPARISON:  None FINDINGS: Lower chest: No consolidation.  No pleural effusion. Hepatobiliary: Heterogeneous appearance of the liver which in general displays low attenuation. Periportal enhancement and some areas of low attenuation extending into the peripheral liver substance more so in posterior division RIGHT portal distribution than in other areas but also in anterior RIGHT. No discrete hepatic lesion aside from a small hypervascular focus in the RIGHT hepatic lobe on image 20 of series 3 that measures 7 mm. Suspected transient hepatic attenuation difference along the anterolateral segment of the LEFT hepatic lobe. No pericholecystic stranding. No biliary duct dilation. Portal vein is highly narrowed at the SMV/portal confluence with an area of low attenuation in the lumen compatible with portal thrombus. Pancreas: Large heterogeneous masslike area in the neck and head of the pancreas anteriorly measuring 4.8 x 2.6 cm. No signs of  significant pancreatic ductal dilation. Infiltration into the transverse mesocolon, soft tissue and nodularity extending into the transverse mesocolon on image 28 of series 3. Also with soft tissue density extending into the hepatic gastric recess of the lesser sac, this area measuring approximately 4.3 x 4.1 cm. No sign of gas in the lesser sac. Spleen: Normal Adrenals/Urinary Tract: Adrenal glands are normal. Symmetric renal enhancement. No hydronephrosis. Urinary bladder is smooth in terms of contour. No focal abnormality Stomach/Bowel: Perigastric stranding.  Some gastric wall thickening. Small bowel with stranding in the area of the ligament of Treitz. Masslike area with low attenuation just above the fourth portion of the duodenum best seen on coronal image 77 of series 6 measuring approximately 5.5 cm greatest coronal dimension with  2.6 cm greatest thickness in the axial plane. Colon with diverticular disease. Mild mesenteric edema in the LEFT hemiabdomen and small volume ascites in the pelvis. Vascular/Lymphatic: Calcified and noncalcified atheromatous plaque in the abdominal aorta. No aneurysmal dilation. Soft tissue surrounds the SMA at the root of the small bowel mesentery image 27 of series 3. Hazy stranding contact approximately 360 degrees with soft tissue showing subtle low-density fat plane in some areas but more pronounced stranding and soft tissue on image 28 of series 3 suggests vascular involvement, also associated with suspected portal invasion. 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 Suspected superimposed bland thrombus in the portal vein just above this area. These are best captured on delayed images 21 through 16 Reproductive: Prostate heterogeneous. The no pelvic lymphadenopathy. Other: No free-air. Infiltration of the omentum with subtle stranding suspicious for peritoneal disease. This is seen along the anterior  abdomen best seen on images 31 of series 3 just below the liver and gallbladder fossa. Also associated with ascites in the pelvis, small volume. Musculoskeletal: Subacute fracture of RIGHT seventh rib. No acute bone finding or signs of destructive bone process. IMPRESSION: 1. Heterogeneous masslike area in the head of the pancreas with findings that are suspicious for extensive local tumor involvement, nodal disease and peritoneal disease. 2. 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 3. Mild mesenteric edema versus tumor infiltration in the LEFT upper quadrant. Correlation with lactate may be helpful in this patient with near occlusion of the SMV. Some collateral pathways are present but a small amount of superimposed acute thrombosis seen on today's study could pose a hazard in this patient. 4. Subtle focus of arterial phase enhancement along the hepatic margin raising the question of hepatic metastatic disease. After resolution of acute symptoms would consider liver MR, preferably when the patient is able to cooperate with breath hold instructions, perhaps as an outpatient. 5. Hazy stranding contact about the SMA which on coronal images shows a fat plane but with extensive involvement with soft tissue at the root of the small bowel mesentery and frank invasion of the SMV as described. 6. Subacute fracture of RIGHT seventh rib. 7. Small volume ascites in the pelvis. 8. Aortic atherosclerosis. A call is out to the referring provider to further discuss findings in the above case. Aortic Atherosclerosis (ICD10-I70.0). Electronically Signed: By: Zetta Bills M.D. On: 08/21/2020 12:00    Procedures Procedures (including critical care time)  Medications Ordered in ED Medications  iohexol (OMNIPAQUE) 300 MG/ML solution 100 mL (100 mLs Intravenous Contrast Given 08/21/20 1131)  ketorolac (TORADOL) 15 MG/ML  injection 15 mg (15 mg Intravenous Given 08/21/20 1225)    ED Course  I have reviewed the triage vital signs and the nursing notes.  Pertinent labs & imaging results that were available during my care of the patient were reviewed by me and considered in my medical decision making (see chart for details).    MDM Rules/Calculators/A&P                          Vital signs within normal limits heart rate is 60 blood pressure within normal limits.  Labs are normal.  White count and hemoglobin.  Given persistent pain, CT abdomen pelvis pursued.  CT imaging concerning for pancreatic mass and multiple tumor findings in the abdomen, portal system,  SMV.  Case discussed with radiology.  Recommending lactic acid which was sent and appears normal.  Patient pain appears well controlled appears comfortable with no tenderness on exam.  Less abdominal exam done at 4:55 PM.  Recommending outpatient follow-up with oncology.  I discussed the case with Dr.Shadad allergist on-call who will follow up with the patient on an outpatient basis.  I advised the patient to return immediately to the ER if he has worsening pain fevers vomiting cough or any additional concerns.   Final Clinical Impression(s) / ED Diagnoses Final diagnoses:  Pancreatic mass    Rx / DC Orders ED Discharge Orders    None       Luna Fuse, MD 08/21/20 1300

## 2020-08-21 NOTE — ED Triage Notes (Signed)
Patient reports intermittent mid/low abdominal pain with nausea this week , no fever or diarrhea.

## 2020-08-21 NOTE — Discharge Instructions (Addendum)
Call your primary care doctor or specialist as discussed in the next 2-3 days.   Return immediately back to the ER if:  Your symptoms worsen within the next 12-24 hours. You develop new symptoms such as new fevers, persistent vomiting, new pain, shortness of breath, or new weakness or numbness, or if you have any other concerns.  

## 2020-08-21 NOTE — ED Notes (Signed)
Patient transported to CT 

## 2020-08-21 NOTE — ED Notes (Signed)
Patient Alert and oriented to baseline. Stable and ambulatory to baseline. Patient verbalized understanding of the discharge instructions.  Patient belongings were taken by the patient.   

## 2020-08-21 NOTE — Progress Notes (Signed)
Jesse Frye Male, 64 y.o., [birth year]  MRN:  [id number] Phone:  [phone] (M) Needs Interpreter: Spanish      PCP:  System, Provider Not In Primary Cvg:  None  Next Appt With Oncology 08/29/2020 at 11:00 AM           RE: CT Biopsy Received: Today Suttle, Rosanne Ashing, MD  Garth Bigness D  No safe percutaneous window for pancreatic mass biopsy. Recommend consideration of endoscopic approach.   [name]        Previous Messages   ----- Message -----  From: Garth Bigness D  Sent: 08/21/2020  2:25 PM EST  To: Ir Procedure Requests  Subject: CT Biopsy                     Procedure:  CT Biopsy   Reason: Pancreatic mass   History:  CT in ocmputer   Provider:  Wyatt Portela   Provider Contact:  [phone]

## 2020-08-28 ENCOUNTER — Encounter (HOSPITAL_COMMUNITY): Payer: Self-pay | Admitting: Radiology

## 2020-08-28 NOTE — Progress Notes (Signed)
LN     1       Jesse Frye Male, 64 y.o., 1956-01-17  MRN:  [id number] Phone:  [phone] Judie Petit) Needs Interpreter: Spanish      PCP:  System, Provider Not In Primary Cvg:  None  Next Appt With Oncology 08/29/2020 at 11:00 AM           RE: CT Biopsy Received: 1 week ago Suttle, Thressa Sheller, MD  Benjiman Core, MD; Henry Russel D  Unfortunately there are no targets for percutaneous biopsy. The local spread into the mesocolon and retroperitoneal area is guarded by named blood vessels and bowel. The involved nodes are all peripancreatic or in porta hepatis. No discrete hepatic masses.    I believe endoscopic approach will be only way to get tissue besides operative. I'm sorry we can't help in this situation.   [name]        Previous Messages   ----- Message -----  From: Benjiman Core, MD  Sent: 08/21/2020  4:36 PM EST  To: Bennie Dallas, MD, Servando Salina  Subject: RE: CT Biopsy                   It does not have to be the pancreatic mass. Biopsy any site of disease will do: liver, lymph node etc..  ----- Message -----  From: Henry Russel D  Sent: 08/21/2020  4:30 PM EST  To: Benjiman Core, MD  Subject: FW: CT Biopsy                   Please see Review below from Dr Elby Showers.  ----- Message -----  From: Bennie Dallas, MD  Sent: 08/21/2020  4:22 PM EST  To: Servando Salina  Subject: RE: CT Biopsy                   No safe percutaneous window for pancreatic mass biopsy. Recommend consideration of endoscopic approach.   [name]  ----- Message -----  From: Henry Russel D  Sent: 08/21/2020  2:25 PM EST  To: Ir Procedure Requests  Subject: CT Biopsy                     Procedure:  CT Biopsy   Reason: Pancreatic mass   History:  CT in ocmputer   Provider:  Benjiman Core   Provider Contact:  [phone]

## 2020-08-29 ENCOUNTER — Other Ambulatory Visit: Payer: Self-pay

## 2020-08-29 ENCOUNTER — Encounter: Payer: Self-pay | Admitting: Oncology

## 2020-08-29 ENCOUNTER — Inpatient Hospital Stay: Payer: Self-pay | Attending: Oncology | Admitting: Oncology

## 2020-08-29 VITALS — BP 143/85 | HR 93 | Temp 98.1°F | Resp 18 | Ht 67.0 in | Wt 165.5 lb

## 2020-08-29 DIAGNOSIS — K8689 Other specified diseases of pancreas: Secondary | ICD-10-CM

## 2020-08-29 MED ORDER — TRAMADOL HCL 50 MG PO TABS: 50.0000 mg | ORAL_TABLET | Freq: Four times a day (QID) | ORAL | 0 refills | Status: DC | PRN

## 2020-08-29 NOTE — Progress Notes (Signed)
Reason for the request:   Pancreatic mass  HPI: I was asked by Dr. Almyra Free to evaluate [name] for the evaluation of pancreatic mass.  64 year old man presented to the emergency department on August 21, 2020 with complaints of abdominal pain for the last week.  Upon evaluation in the emergency department he underwent a CT scan of the abdomen and pelvis.  The CT scan showed a heterogeneous mass in the head of the pancreas with findings suspicious for extent of local tumor involvement and nodal disease as well as peritoneal disease.  He also has some mild mesenteric edema versus infiltrative tumor of the left upper quadrant.  He is laboratory testing at that time showed normal lactic acid and normal kidney function and CBC parameters.  He was discharged home for a follow-up.  Consultation with interventional radiology felt that his tumor cannot be biopsied percutaneously an endoscopic approach was recommended.  Clinically, he reports continuous abdominal discomfort which is described as predominantly cramping and bloating feeling.  He also has some sharp pain radiating to the middle of his back.  He denies any nausea vomiting or constipation.  He does report some fatigue and tiredness but still able to do work related duties.  He does not report any headaches, blurry vision, syncope or seizures. Does not report any fevers, chills or sweats.  Does not report any cough, wheezing or hemoptysis.  Does not report any chest pain, palpitation, orthopnea or leg edema.  Does not report any nausea, vomiting. Does not report any constipation or diarrhea.  Does not report any skeletal complaints.    Does not report frequency, urgency or hematuria.  Does not report any skin rashes or lesions. Does not report any heat or cold intolerance.  Does not report any lymphadenopathy or petechiae.  Does not report any anxiety or depression.  Remaining review of systems is negative.    Past Medical History:  Diagnosis Date  .  Abdominal pain on and off for 2 months  . Abdominal swelling   . History of rectal bleeding    for 1 week  . Hyperlipidemia    no meds  :  Past Surgical History:  Procedure Laterality Date  . broken arm     right arm  . rectal fistula    :   Current Outpatient Medications:  .  clindamycin (CLEOCIN) 150 MG capsule, Take 1 capsule (150 mg total) by mouth every 6 (six) hours., Disp: 28 capsule, Rfl: 0 .  Omega-3 Fatty Acids (FISH OIL PO), Take 1 tablet by mouth daily., Disp: , Rfl:  .  traMADol (ULTRAM) 50 MG tablet, Take 1 tablet (50 mg total) by mouth every 6 (six) hours as needed., Disp: 15 tablet, Rfl: 0:  Allergies  Allergen Reactions  . Penicillins Other (See Comments)    Childhood reaction  :  Family History  Problem Relation Age of Onset  . Esophageal cancer Father   :  Social History   Socioeconomic History  . Marital status: Married    Spouse name: Not on file  . Number of children: Not on file  . Years of education: Not on file  . Highest education level: Not on file  Occupational History  . Not on file  Tobacco Use  . Smoking status: Current Every Day Smoker    Types: Cigarettes  . Smokeless tobacco: Never Used  . Tobacco comment: Pt smokes 4-6 cigarettes day.  Substance and Sexual Activity  . Alcohol use: No  . Drug  use: No  . Sexual activity: Not on file  Other Topics Concern  . Not on file  Social History Narrative  . Not on file   Social Determinants of Health   Financial Resource Strain: Not on file  Food Insecurity: Not on file  Transportation Needs: Not on file  Physical Activity: Not on file  Stress: Not on file  Social Connections: Not on file  Intimate Partner Violence: Not on file  :  Pertinent items are noted in HPI.  Exam: Blood pressure (!) 143/85, pulse 93, temperature 98.1 F (36.7 C), temperature source Tympanic, resp. rate 18, height 5\' 7"  (1.702 m), weight 165 lb 8 oz (75.1 kg), SpO2 100 %.   ECOG 1 General  appearance: alert and cooperative appeared without distress. Head: atraumatic without any abnormalities. Eyes: conjunctivae/corneas clear. PERRL.  Sclera anicteric. Throat: lips, mucosa, and tongue normal; without oral thrush or ulcers. Resp: clear to auscultation bilaterally without rhonchi, wheezes or dullness to percussion. Cardio: regular rate and rhythm, S1, S2 normal, no murmur, click, rub or gallop GI: soft, non-tender; bowel sounds normal; no masses,  no organomegaly Skin: Skin color, texture, turgor normal. No rashes or lesions Lymph nodes: Cervical, supraclavicular, and axillary nodes normal. Neurologic: Grossly normal without any motor, sensory or deep tendon reflexes. Musculoskeletal: No joint deformity or effusion.    CT Abdomen Pelvis W Contrast  Addendum Date: 08/21/2020   ADDENDUM REPORT: 08/21/2020 12:12 ADDENDUM: Findings were discussed via telephone at the time of dictation given SMV narrowing suspected superimposed acute thrombus lactate was suggested for correlation to exclude developing venous ischemia though bowel enhancement is preserved and symmetric on today's study. Origin of suspected neoplasm may be upper gastrointestinal versus pancreatic based on the appearance of the gastric antrum which is also inseparable from soft tissue in the lesser sac. These results were called by telephone at the time of interpretation on 08/21/2020 at 12:12 pm to provider [facility] , who verbally acknowledged these results. Electronically Signed   By: Zetta Bills M.D.   On: 08/21/2020 12:12   Result Date: 08/21/2020 CLINICAL DATA:  Acute abdominal pain for 8 days, diffuse abdominal pain EXAM: CT ABDOMEN AND PELVIS WITH CONTRAST TECHNIQUE: Multidetector CT imaging of the abdomen and pelvis was performed using the standard protocol following bolus administration of intravenous contrast. CONTRAST:  121mL OMNIPAQUE IOHEXOL 300 MG/ML  SOLN COMPARISON:  None FINDINGS: Lower chest: No  consolidation.  No pleural effusion. Hepatobiliary: Heterogeneous appearance of the liver which in general displays low attenuation. Periportal enhancement and some areas of low attenuation extending into the peripheral liver substance more so in posterior division RIGHT portal distribution than in other areas but also in anterior RIGHT. No discrete hepatic lesion aside from a small hypervascular focus in the RIGHT hepatic lobe on image 20 of series 3 that measures 7 mm. Suspected transient hepatic attenuation difference along the anterolateral segment of the LEFT hepatic lobe. No pericholecystic stranding. No biliary duct dilation. Portal vein is highly narrowed at the SMV/portal confluence with an area of low attenuation in the lumen compatible with portal thrombus. Pancreas: Large heterogeneous masslike area in the neck and head of the pancreas anteriorly measuring 4.8 x 2.6 cm. No signs of significant pancreatic ductal dilation. Infiltration into the transverse mesocolon, soft tissue and nodularity extending into the transverse mesocolon on image 28 of series 3. Also with soft tissue density extending into the hepatic gastric recess of the lesser sac, this area measuring approximately 4.3 x 4.1 cm.  No sign of gas in the lesser sac. Spleen: Normal Adrenals/Urinary Tract: Adrenal glands are normal. Symmetric renal enhancement. No hydronephrosis. Urinary bladder is smooth in terms of contour. No focal abnormality Stomach/Bowel: Perigastric stranding.  Some gastric wall thickening. Small bowel with stranding in the area of the ligament of Treitz. Masslike area with low attenuation just above the fourth portion of the duodenum best seen on coronal image 77 of series 6 measuring approximately 5.5 cm greatest coronal dimension with 2.6 cm greatest thickness in the axial plane. Colon with diverticular disease. Mild mesenteric edema in the LEFT hemiabdomen and small volume ascites in the pelvis. Vascular/Lymphatic:  Calcified and noncalcified atheromatous plaque in the abdominal aorta. No aneurysmal dilation. Soft tissue surrounds the SMA at the root of the small bowel mesentery image 27 of series 3. Hazy stranding contact approximately 360 degrees with soft tissue showing subtle low-density fat plane in some areas but more pronounced stranding and soft tissue on image 28 of series 3 suggests vascular involvement, also associated with suspected portal invasion. 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 Suspected superimposed bland thrombus in the portal vein just above this area. These are best captured on delayed images 21 through 16 Reproductive: Prostate heterogeneous. The no pelvic lymphadenopathy. Other: No free-air. Infiltration of the omentum with subtle stranding suspicious for peritoneal disease. This is seen along the anterior abdomen best seen on images 31 of series 3 just below the liver and gallbladder fossa. Also associated with ascites in the pelvis, small volume. Musculoskeletal: Subacute fracture of RIGHT seventh rib. No acute bone finding or signs of destructive bone process. IMPRESSION: 1. Heterogeneous masslike area in the head of the pancreas with findings that are suspicious for extensive local tumor involvement, nodal disease and peritoneal disease. 2. 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 3. Mild mesenteric edema versus tumor infiltration in the LEFT upper quadrant. Correlation with lactate may be helpful in this patient with near occlusion of the SMV. Some collateral pathways are present but a small amount of superimposed acute thrombosis seen on today's study could pose a hazard in this patient. 4. Subtle focus of arterial phase enhancement along the hepatic margin raising the question of hepatic metastatic disease. After  resolution of acute symptoms would consider liver MR, preferably when the patient is able to cooperate with breath hold instructions, perhaps as an outpatient. 5. Hazy stranding contact about the SMA which on coronal images shows a fat plane but with extensive involvement with soft tissue at the root of the small bowel mesentery and frank invasion of the SMV as described. 6. Subacute fracture of RIGHT seventh rib. 7. Small volume ascites in the pelvis. 8. Aortic atherosclerosis. A call is out to the referring provider to further discuss findings in the above case. Aortic Atherosclerosis (ICD10-I70.0). Electronically Signed: By: Donzetta Kohut M.D. On: 08/21/2020 12:00    Assessment and Plan:    64 year old man with:  1.  Head of the pancreas mass suspicious for pancreatic malignancy as well as regional adenopathy possibly peritoneal disease indicating advanced malignancy.  The natural course of this disease and treatment options were reviewed.  Work-up of this malignancy would include updating staging scan of the chest as well as obtaining tissue biopsy.  His case was discussed with interventional radiology and felt that percutaneous biopsy is not feasible and recommended endoscopic evaluation.  I will make the appropriate referral to GI for the EUS and a biopsy.   The differential diagnosis was discussed at this time.  This likely represents metastatic pancreatic cancer adenocarcinoma histology with neuroendocrine tumor would be a possibility.  Treatment options were discussed at this time.  Pending the final pathology we will not finalize any treatment but his tumor is inoperable and any treatment at this time would be palliative utilizing systemic therapy.  Systemic chemotherapy would be the treatment choice for advanced adenocarcinoma of the pancreas.  Regimen such as FOLFIRINOX, gemcitabine with Abraxane, and others were discussed.  The plan is to complete his staging as well as obtain tumor  markers as well as obtaining tissue biopsy endoscopically.  Once these results are in, we will discuss treatment at that time.  2.  Abdominal pain: Prescription for tramadol was given to him.  His abdominal pain is related to pancreatic cancer.  Stronger pain medication were declined at this time given the severity of his pain.  3.  Diet and nutrition: He is experiencing massive weight loss which could be detrimental to his health long-term.  We have discussed strategies to improve his dietary intake and nutritional consult will be warranted once we start treatment.   4.  Follow-up: Will be in the near future after completing his staging work-up.   60  minutes were dedicated to this visit. The time was spent on reviewing laboratory data, imaging studies, discussing treatment options, discussing differential diagnosis and answering questions regarding future plan.   A copy of this consult has been forwarded to the requesting physician.

## 2020-08-30 ENCOUNTER — Telehealth: Payer: Self-pay | Admitting: Oncology

## 2020-08-30 ENCOUNTER — Telehealth: Payer: Self-pay | Admitting: Internal Medicine

## 2020-08-30 NOTE — Telephone Encounter (Signed)
Left message with translation services with follow-up appointments per 12/29 los. Mailed patient's schedule as well.

## 2020-08-30 NOTE — Telephone Encounter (Signed)
Let me look at schedule and try to find him a spot. GM

## 2020-08-30 NOTE — Telephone Encounter (Signed)
Vonna Kotyk, The my soonest available appt is currently Thursday Jan 20th. I am happy to see him then for upper EUS if there are no earlier options.  I reviewed his CT, looks like IR may be able to biopsy this as well, the tumor is very large and looks pretty close to subcutaneous tissues in some spots if GM does not have sooner availability than the 20th.  Patty, See above

## 2020-08-30 NOTE — Telephone Encounter (Signed)
Feb 10 is Dr Mansouraty's first available unless he wants to add somewhere?

## 2020-08-30 NOTE — Telephone Encounter (Signed)
Caryl Never, Catherin S  Channon Ambrosini, Carie Caddy, MD Hi Dr. Rhea Belton,   We have [facility]. This is an established patient who you saw last time in 2015. Please advise on scheduling.   Thank you,    Lynden Ang    The above message copied from referral notes sent to me today. Patient known to me from screening colonoscopy performed 6-1/2 years ago  Recent diagnosis of large pancreatic mass concerning for malignancy as seen by CT scan from 9 days ago.  He has already been seen by oncology, Dr. Clelia Croft, yesterday who recommends EUS  I am forwarding this note to Dr. Christella Hartigan and Dr. Meridee Score, my colleagues who performed EUS.  Dr. Christella Hartigan and Dr. Meridee Score, could either review please help facilitate an EUS for this patient with likely pancreatic malignancy.  If you feel he needs to be seen prior to EUS I can look for him an appointment with either me or one of our advanced practitioners.  Thank you both in advance for your help JMP

## 2020-09-02 NOTE — Telephone Encounter (Signed)
Patty see if you can put this patient on 1/12 at noon at [facility] long if this is possible then hold my 130 LEC slot as off so that I can do this procedure and then come in for the rest of the afternoon at the [facility].  This would be most ideal. If this does not work then on 1/13 the following should be done to try and add this patient: -First patient should be a total of 90 minutes making and time at 9 AM -Second patient begins at 9 AM and completes at 10 AM -Third patient begins at 10 AM and ends at 11:45 AM -Place this patient as the fourth patient to begin at 11:45 AM  EGD/EUS linear  Update [place] once this is completed. Thanks. GM

## 2020-09-03 ENCOUNTER — Other Ambulatory Visit: Payer: Self-pay

## 2020-09-03 ENCOUNTER — Telehealth: Payer: Self-pay | Admitting: Gastroenterology

## 2020-09-03 DIAGNOSIS — K8689 Other specified diseases of pancreas: Secondary | ICD-10-CM

## 2020-09-03 NOTE — Telephone Encounter (Signed)
Thanks for update. GM 

## 2020-09-03 NOTE — Telephone Encounter (Signed)
EUS has been scheduled for 09/13/20 at 1215 pm at [facility].  COVID test scheduled for 09/10/20 at 940 am

## 2020-09-03 NOTE — Telephone Encounter (Signed)
Dr Meridee Score can you call me when you get a chance about this schedule?

## 2020-09-03 NOTE — Telephone Encounter (Signed)
See Alternate note 1/3

## 2020-09-03 NOTE — Telephone Encounter (Signed)
Pt's daughter Matilde Sprang called looking to schedule pt's EUS/EGD. Pls call her.

## 2020-09-03 NOTE — Telephone Encounter (Signed)
I spoke with the pt's daughter regarding the EUS.  We discussed at length the prep and appt date and time.  I also have mailed a copy to the pt and sent her a link to download My Chart so that all the information can be viewed today.  The pt has been advised of the information and verbalized understanding.

## 2020-09-04 ENCOUNTER — Telehealth: Payer: Self-pay

## 2020-09-04 ENCOUNTER — Other Ambulatory Visit: Payer: Self-pay

## 2020-09-04 DIAGNOSIS — K8689 Other specified diseases of pancreas: Secondary | ICD-10-CM

## 2020-09-04 NOTE — Progress Notes (Signed)
ambulato

## 2020-09-04 NOTE — Progress Notes (Signed)
Attempted to reach patient to discuss my role as nurse navigator and to identify any navigation needs.  No answer and voice mail box is full.  I see where other offices have spoken to his daughter but there is no number on file for her.

## 2020-09-04 NOTE — Progress Notes (Signed)
Left voice message for patient's wife to call me back regarding patient's plan of care.

## 2020-09-04 NOTE — Telephone Encounter (Signed)
Patient's daughter called requesting a referral to [facility]. Patient had already been referred to [facility] by Dr. Clelia Croft but per patient's daughter the patient cannot be seen for another week and she called [facility] and was told they can see the patient sooner. Dr. Clelia Croft made aware and referral faxed to [facility] at [phone]. Confirmation fax received. Phone number to [facility] is [phone].

## 2020-09-05 ENCOUNTER — Inpatient Hospital Stay (HOSPITAL_COMMUNITY): Payer: Self-pay

## 2020-09-05 ENCOUNTER — Other Ambulatory Visit: Payer: Self-pay

## 2020-09-05 ENCOUNTER — Telehealth: Payer: Self-pay | Admitting: *Deleted

## 2020-09-05 ENCOUNTER — Emergency Department (HOSPITAL_COMMUNITY): Payer: Self-pay

## 2020-09-05 ENCOUNTER — Inpatient Hospital Stay (HOSPITAL_COMMUNITY)
Admission: EM | Admit: 2020-09-05 | Discharge: 2020-09-10 | DRG: 436 | Disposition: A | Discharge status: Home / Self Care | Payer: Self-pay | Attending: Internal Medicine | Admitting: Internal Medicine

## 2020-09-05 ENCOUNTER — Encounter (HOSPITAL_COMMUNITY): Payer: Self-pay

## 2020-09-05 DIAGNOSIS — R180 Malignant ascites: Secondary | ICD-10-CM | POA: Diagnosis present

## 2020-09-05 DIAGNOSIS — E872 Acidosis: Secondary | ICD-10-CM | POA: Diagnosis present

## 2020-09-05 DIAGNOSIS — R112 Nausea with vomiting, unspecified: Secondary | ICD-10-CM | POA: Diagnosis present

## 2020-09-05 DIAGNOSIS — R109 Unspecified abdominal pain: Secondary | ICD-10-CM

## 2020-09-05 DIAGNOSIS — E785 Hyperlipidemia, unspecified: Secondary | ICD-10-CM | POA: Diagnosis present

## 2020-09-05 DIAGNOSIS — E871 Hypo-osmolality and hyponatremia: Secondary | ICD-10-CM | POA: Diagnosis present

## 2020-09-05 DIAGNOSIS — Z880 Allergy status to penicillin: Secondary | ICD-10-CM

## 2020-09-05 DIAGNOSIS — S2231XA Fracture of one rib, right side, initial encounter for closed fracture: Secondary | ICD-10-CM | POA: Diagnosis present

## 2020-09-05 DIAGNOSIS — C259 Malignant neoplasm of pancreas, unspecified: Principal | ICD-10-CM | POA: Diagnosis present

## 2020-09-05 DIAGNOSIS — Z6825 Body mass index (BMI) 25.0-25.9, adult: Secondary | ICD-10-CM

## 2020-09-05 DIAGNOSIS — R627 Adult failure to thrive: Secondary | ICD-10-CM | POA: Diagnosis present

## 2020-09-05 DIAGNOSIS — X58XXXA Exposure to other specified factors, initial encounter: Secondary | ICD-10-CM | POA: Diagnosis present

## 2020-09-05 DIAGNOSIS — K5900 Constipation, unspecified: Secondary | ICD-10-CM | POA: Diagnosis present

## 2020-09-05 DIAGNOSIS — Z20822 Contact with and (suspected) exposure to covid-19: Secondary | ICD-10-CM | POA: Diagnosis present

## 2020-09-05 DIAGNOSIS — R188 Other ascites: Secondary | ICD-10-CM

## 2020-09-05 DIAGNOSIS — C786 Secondary malignant neoplasm of retroperitoneum and peritoneum: Secondary | ICD-10-CM | POA: Diagnosis present

## 2020-09-05 DIAGNOSIS — Z800 Family history of malignant neoplasm of digestive organs: Secondary | ICD-10-CM

## 2020-09-05 DIAGNOSIS — R1084 Generalized abdominal pain: Secondary | ICD-10-CM

## 2020-09-05 DIAGNOSIS — E860 Dehydration: Secondary | ICD-10-CM | POA: Diagnosis present

## 2020-09-05 LAB — COMPREHENSIVE METABOLIC PANEL
ALT: 22 U/L (ref 0–44)
AST: 26 U/L (ref 15–41)
Albumin: 3 g/dL — ABNORMAL LOW (ref 3.5–5.0)
Alkaline Phosphatase: 55 U/L (ref 38–126)
Anion gap: 11 (ref 5–15)
BUN: 23 mg/dL (ref 8–23)
CO2: 24 mmol/L (ref 22–32)
Calcium: 8.1 mg/dL — ABNORMAL LOW (ref 8.9–10.3)
Chloride: 88 mmol/L — ABNORMAL LOW (ref 98–111)
Creatinine, Ser: 0.88 mg/dL (ref 0.61–1.24)
GFR, Estimated: >60 mL/min (ref >60)
Glucose, Bld: 108 mg/dL — ABNORMAL HIGH (ref 70–99)
Potassium: 4.6 mmol/L (ref 3.5–5.1)
Sodium: 123 mmol/L — ABNORMAL LOW (ref 135–145)
Total Bilirubin: 1.1 mg/dL (ref 0.3–1.2)
Total Protein: 6.6 g/dL (ref 6.5–8.1)

## 2020-09-05 LAB — CBC WITH DIFFERENTIAL/PLATELET
Abs Immature Granulocytes: 0.05 10*3/uL (ref 0.00–0.07)
Basophils Absolute: 0.1 10*3/uL (ref 0.0–0.1)
Basophils Relative: 1 %
Eosinophils Absolute: 0 10*3/uL (ref 0.0–0.5)
Eosinophils Relative: 0 %
HCT: 38.5 % — ABNORMAL LOW (ref 39.0–52.0)
Hemoglobin: 13.2 g/dL (ref 13.0–17.0)
Immature Granulocytes: 1 %
Lymphocytes Relative: 6 %
Lymphs Abs: 0.6 10*3/uL — ABNORMAL LOW (ref 0.7–4.0)
MCH: 31.1 pg (ref 26.0–34.0)
MCHC: 34.3 g/dL (ref 30.0–36.0)
MCV: 90.8 fL (ref 80.0–100.0)
Monocytes Absolute: 1 10*3/uL (ref 0.1–1.0)
Monocytes Relative: 9 %
Neutro Abs: 9.3 10*3/uL — ABNORMAL HIGH (ref 1.7–7.7)
Neutrophils Relative %: 83 %
Platelets: 400 10*3/uL (ref 150–400)
RBC: 4.24 MIL/uL (ref 4.22–5.81)
RDW: 12.5 % (ref 11.5–15.5)
WBC: 11.1 10*3/uL — ABNORMAL HIGH (ref 4.0–10.5)
nRBC: 0 % (ref 0.0–0.2)

## 2020-09-05 LAB — BASIC METABOLIC PANEL
Anion gap: 10 (ref 5–15)
Anion gap: 11 (ref 5–15)
BUN: 20 mg/dL (ref 8–23)
BUN: 18 mg/dL (ref 8–23)
CO2: 25 mmol/L (ref 22–32)
CO2: 23 mmol/L (ref 22–32)
Calcium: 7.8 mg/dL — ABNORMAL LOW (ref 8.9–10.3)
Calcium: 7.9 mg/dL — ABNORMAL LOW (ref 8.9–10.3)
Chloride: 92 mmol/L — ABNORMAL LOW (ref 98–111)
Chloride: 93 mmol/L — ABNORMAL LOW (ref 98–111)
Creatinine, Ser: 0.77 mg/dL (ref 0.61–1.24)
Creatinine, Ser: 0.78 mg/dL (ref 0.61–1.24)
GFR, Estimated: >60 mL/min (ref >60)
GFR, Estimated: >60 mL/min (ref >60)
Glucose, Bld: 82 mg/dL (ref 70–99)
Glucose, Bld: 70 mg/dL (ref 70–99)
Potassium: 4.7 mmol/L (ref 3.5–5.1)
Potassium: 4.3 mmol/L (ref 3.5–5.1)
Sodium: 127 mmol/L — ABNORMAL LOW (ref 135–145)
Sodium: 127 mmol/L — ABNORMAL LOW (ref 135–145)

## 2020-09-05 LAB — PROTEIN, PLEURAL OR PERITONEAL FLUID: Total protein, fluid: 3.2 g/dL

## 2020-09-05 LAB — GLUCOSE, PLEURAL OR PERITONEAL FLUID: Glucose, Fluid: 66 mg/dL

## 2020-09-05 LAB — BODY FLUID CELL COUNT WITH DIFFERENTIAL
Eos, Fluid: 1 %
Lymphs, Fluid: 44 %
Monocyte-Macrophage-Serous Fluid: 12 % — ABNORMAL LOW (ref 50–90)
Neutrophil Count, Fluid: 43 % — ABNORMAL HIGH (ref 0–25)
Total Nucleated Cell Count, Fluid: 2344 cu mm — ABNORMAL HIGH (ref 0–1000)

## 2020-09-05 LAB — RESP PANEL BY RT-PCR (FLU A&B, COVID) ARPGX2
Influenza A by PCR: NEGATIVE
Influenza B by PCR: NEGATIVE
SARS Coronavirus 2 by RT PCR: NEGATIVE

## 2020-09-05 LAB — LACTATE DEHYDROGENASE, PLEURAL OR PERITONEAL FLUID: LD, Fluid: 329 U/L — ABNORMAL HIGH (ref 3–23)

## 2020-09-05 LAB — LIPASE, BLOOD: Lipase: 22 U/L (ref 11–51)

## 2020-09-05 LAB — LACTATE DEHYDROGENASE: LDH: 121 U/L (ref 98–192)

## 2020-09-05 LAB — PROTEIN, TOTAL: Total Protein: 6.3 g/dL — ABNORMAL LOW (ref 6.5–8.1)

## 2020-09-05 LAB — LACTIC ACID, PLASMA: Lactic Acid, Venous: 2.1 mmol/L (ref 0.5–1.9)

## 2020-09-05 LAB — PROTIME-INR
INR: 1.1 (ref 0.8–1.2)
Prothrombin Time: 13.6 seconds (ref 11.4–15.2)

## 2020-09-05 LAB — HIV ANTIBODY (ROUTINE TESTING W REFLEX): HIV Screen 4th Generation wRfx: NONREACTIVE

## 2020-09-05 MED ORDER — ACETAMINOPHEN 325 MG PO TABS: 650.0000 mg | ORAL_TABLET | Freq: Four times a day (QID) | ORAL | Status: DC | PRN

## 2020-09-05 MED ORDER — ALBUMIN HUMAN 25 % IV SOLN
12.5000 g | Freq: Once | INTRAVENOUS | Status: AC
  Administered 2020-09-05: 12.5 g via INTRAVENOUS
  Filled 2020-09-05: qty 50

## 2020-09-05 MED ORDER — FENTANYL CITRATE (PF) 100 MCG/2ML IJ SOLN
12.5000 ug | INTRAMUSCULAR | Status: DC | PRN
Start: 2020-09-05 — End: 2020-09-10
  Administered 2020-09-05 – 2020-09-06 (×4): 50 ug via INTRAVENOUS
  Administered 2020-09-06: 25 ug via INTRAVENOUS
  Administered 2020-09-07 – 2020-09-10 (×9): 50 ug via INTRAVENOUS
  Filled 2020-09-05 (×14): qty 2

## 2020-09-05 MED ORDER — SODIUM CHLORIDE 0.9 % IV BOLUS
1000.0000 mL | Freq: Once | INTRAVENOUS | Status: AC
  Administered 2020-09-05: 1000 mL via INTRAVENOUS

## 2020-09-05 MED ORDER — SODIUM CHLORIDE 0.9 % IV SOLN: INTRAVENOUS | Status: DC

## 2020-09-05 MED ORDER — ONDANSETRON HCL 4 MG/2ML IJ SOLN: 4.0000 mg | Freq: Four times a day (QID) | INTRAMUSCULAR | Status: DC | PRN

## 2020-09-05 MED ORDER — LIDOCAINE HCL 1 % IJ SOLN
INTRAMUSCULAR | Status: AC
  Filled 2020-09-05: qty 20

## 2020-09-05 MED ORDER — ACETAMINOPHEN 650 MG RE SUPP: 650.0000 mg | Freq: Four times a day (QID) | RECTAL | Status: DC | PRN

## 2020-09-05 MED ORDER — HYDROMORPHONE HCL 1 MG/ML IJ SOLN
0.5000 mg | Freq: Once | INTRAMUSCULAR | Status: AC
  Administered 2020-09-05: 0.5 mg via INTRAVENOUS
  Filled 2020-09-05: qty 1

## 2020-09-05 MED ORDER — ONDANSETRON HCL 4 MG PO TABS: 4.0000 mg | ORAL_TABLET | Freq: Four times a day (QID) | ORAL | Status: DC | PRN

## 2020-09-05 MED ORDER — ONDANSETRON HCL 4 MG/2ML IJ SOLN
4.0000 mg | Freq: Once | INTRAMUSCULAR | Status: AC
  Administered 2020-09-05: 4 mg via INTRAVENOUS
  Filled 2020-09-05: qty 2

## 2020-09-05 NOTE — Procedures (Signed)
Ultrasound-guided diagnostic and therapeutic paracentesis performed yielding 6.1 liters of hazy, slightly blood-tinged/amber colored fluid. No immediate complications.  A portion of the fluid was submitted to the lab for preordered studies.  EBL none.

## 2020-09-05 NOTE — ED Notes (Signed)
Patient transported to XR at this time.

## 2020-09-05 NOTE — ED Provider Notes (Signed)
[facility] DEPT Provider Note   CSN: [id number] Arrival date & time: 09/05/20  [id number]     History Chief Complaint  Patient presents with  . Abdominal Pain    Jesse Frye is a 65 y.o. male. Patient speaks Spanish primarily.  Interpreter offered but patient states he would rather speak [place]. HPI Patient presents with abdominal pain and swelling.  Is had for around the last 4 days.  Decreased appetite.  States he has been eating much less due to feeling full.  Does have nausea but has not been vomiting.  States he has not been passing bowel movements.  Has presumed pancreatic cancer on CT from December 21.  However has not had tissue diagnosis yet.  It was in the head of the pancreas with likely extensive spread.  No fevers.    Past Medical History:  Diagnosis Date  . Abdominal pain on and off for 2 months  . Abdominal swelling   . History of rectal bleeding    for 1 week  . Hyperlipidemia    no meds    There are no problems to display for this patient.   Past Surgical History:  Procedure Laterality Date  . broken arm     right arm  . rectal fistula         Family History  Problem Relation Age of Onset  . Esophageal cancer Father     Social History   Tobacco Use  . Smoking status: Former Smoker    Types: Cigarettes    Quit date: 08/29/2014    Years since quitting: 6.0  . Smokeless tobacco: Never Used  . Tobacco comment: Pt used to smoke 4-6 cigarettes day but has now quit as of 7 years ago  Substance Use Topics  . Alcohol use: No  . Drug use: No    Home Medications Prior to Admission medications   Medication Sig Start Date End Date Taking? Authorizing Provider  clindamycin (CLEOCIN) 150 MG capsule Take 1 capsule (150 mg total) by mouth every 6 (six) hours. Patient not taking: Reported on 08/29/2020 02/12/15   Delos Haring, PA-C  Omega-3 Fatty Acids (FISH OIL PO) Take 1 tablet by mouth daily. Patient not taking:  Reported on 08/29/2020    [provider]  traMADol (ULTRAM) 50 MG tablet Take 1 tablet (50 mg total) by mouth every 6 (six) hours as needed. Patient not taking: Reported on 08/29/2020 02/12/15   Delos Haring, PA-C  traMADol (ULTRAM) 50 MG tablet Take 1 tablet (50 mg total) by mouth every 6 (six) hours as needed. 08/29/20   Wyatt Portela, MD    Allergies    Penicillins  Review of Systems   Review of Systems  Constitutional: Positive for appetite change and unexpected weight change.  HENT: Negative for congestion.   Respiratory: Negative for shortness of breath.   Gastrointestinal: Positive for abdominal distention, abdominal pain, constipation, nausea and vomiting.  Genitourinary: Negative for frequency.  Musculoskeletal: Positive for back pain.  Skin: Negative for rash.  Neurological: Positive for weakness.  Psychiatric/Behavioral: Negative for confusion.    Physical Exam Updated Vital Signs BP 131/78   Pulse 88   Temp 98 F (36.7 C) (Oral)   Resp 20   Ht 5\' 7"  (1.702 m)   Wt 72.6 kg   SpO2 93%   BMI 25.06 kg/m   Physical Exam Vitals and nursing note reviewed.  HENT:     Head: Normocephalic.  Eyes:  General: No scleral icterus.    Pupils: Pupils are equal, round, and reactive to light.  Cardiovascular:     Rate and Rhythm: Normal rate and regular rhythm.  Pulmonary:     Breath sounds: No wheezing.  Abdominal:     Comments: Distended abdomen.  Mild diffuse tenderness.  Not hollow sounding.  Skin:    Capillary Refill: Capillary refill takes less than 2 seconds.  Neurological:     Mental Status: He is alert and oriented to person, place, and time.  Psychiatric:        Mood and Affect: Mood normal.     ED Results / Procedures / Treatments   Labs (all labs ordered are listed, but only abnormal results are displayed) Labs Reviewed  COMPREHENSIVE METABOLIC PANEL - Abnormal; Notable for the following components:      Result Value   Sodium 123  (*)    Chloride 88 (*)    Glucose, Bld 108 (*)    Calcium 8.1 (*)    Albumin 3.0 (*)    All other components within normal limits  LACTIC ACID, PLASMA - Abnormal; Notable for the following components:   Lactic Acid, Venous 2.1 (*)    All other components within normal limits  CBC WITH DIFFERENTIAL/PLATELET - Abnormal; Notable for the following components:   WBC 11.1 (*)    HCT 38.5 (*)    Neutro Abs 9.3 (*)    Lymphs Abs 0.6 (*)    All other components within normal limits  RESP PANEL BY RT-PCR (FLU A&B, COVID) [id number]  LIPASE, BLOOD    EKG None  Radiology DG Abdomen Acute W/Chest  Result Date: 09/05/2020 CLINICAL DATA:  Nausea, vomiting, loss of appetite and abdominal distension developing over the past few days. EXAM: DG ABDOMEN ACUTE WITH 1 VIEW CHEST COMPARISON:  PA and lateral chest 04/23/2028. CT abdomen and pelvis 08/21/2020. FINDINGS: Single-view of the chest demonstrates subsegmental atelectasis in the lower lung zones. No pneumothorax or pleural fluid. Heart size is normal. Aortic atherosclerosis. Two views of the abdomen show no free intraperitoneal air. The bowel gas pattern is nonobstructive. No acute or focal bony abnormality. IMPRESSION: No acute abnormality chest or abdomen. Electronically Signed   By: Drusilla Kanner M.D.   On: 09/05/2020 11:26    Procedures Procedures (including critical care time)  Medications Ordered in ED Medications  sodium chloride 0.9 % bolus 1,000 mL (1,000 mLs Intravenous New Bag/Given 09/05/20 1130)  ondansetron (ZOFRAN) injection 4 mg (4 mg Intravenous Given 09/05/20 1130)  HYDROmorphone (DILAUDID) injection 0.5 mg (0.5 mg Intravenous Given 09/05/20 1130)    ED Course  I have reviewed the triage vital signs and the nursing notes.  Pertinent labs & imaging results that were available during my care of the patient were reviewed by me and considered in my medical decision making (see chart for details).    MDM Rules/Calculators/A&P                           Patient presents with abdominal pain nausea vomiting and increasing distention of abdomen. Rather recently discovered pancreatic cancer although do not have tissue diagnosis yet. Vomiting spite antiemetics at home and despite increase in pain medicine. Had CT scan done 3 days ago at [facility]. Did not show obstruction at that time. 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 White count mildly elevated. X-ray done and showed no obstruction. Has only minimally elevated lactic acid. Hyponatremia and hypochloremia. I think  with worsening symptoms despite increasing outpatient management patient benefit from admission to the hospital. Feels somewhat better after some IV fluids and IV Dilaudid here. Has increasing ascites. Potential benefit from IR drain. Will admit to hospitalist. Doubt SBP. Bowel ischemia also felt less likely. Final Clinical Impression(s) / ED Diagnoses Final diagnoses:  Abdominal pain, unspecified abdominal location  Nausea and vomiting, intractability of vomiting not specified, unspecified vomiting type  Malignant neoplasm of pancreas, unspecified location of malignancy [facility])    Rx / DC Orders ED Discharge Orders    None       Davonna Belling, MD 09/05/20 1306

## 2020-09-05 NOTE — ED Triage Notes (Signed)
Pt arrived via walk in, c/o diffuse abd pain x4 days, no appetite and hiccups cont for 24hrs. Denies any n/v diarrhea or urinary sx. Hx of pancreatic CA.

## 2020-09-05 NOTE — Telephone Encounter (Signed)
Daughter LM stating patient "has not eaten in 4 days, everything is coming back up and abdomen is very swollen. Deteriorating rapidly."  RN returned her call. States she spoke with Navigator, [place]. Mr Jiles is going to the [facility] ED.

## 2020-09-05 NOTE — ED Notes (Signed)
  Test: lactic Critical Value: 2.1  Name of Provider Notified: pickering

## 2020-09-05 NOTE — H&P (Signed)
History and Physical    Manu Rubey [id number] DOB: 1956/01/16 DOA: 09/05/2020  PCP: System, Provider Not In  Patient coming from: Home  Chief Complaint: abdominal pain.  HPI: Jesse Frye is a 65 y.o. male with medical history significant of pancreatic cancer. Presents today with stomach distention/pain and intractable N/V. His symptoms started December 23rd. He tried tramadol and position changes to help with the pain, but they did not provide relief. He says that over the last 4 days, he's had N/V which has increased to the point that he is not able to eat. He became concerned and came to the ED.     Of note, he has a recent diagnosis of a pancreatic head mass w/ suspicion of a metastatic cancer. He has been working with GI and Onco to arrange an EUS. He had it scheduled for next week. Per Dr. Alver Fisher last note; he is likely only to have palliative treatment.   ED Course: Found to be hyponatremic. Found to have ascites. Paracentesis ordered. TRH called for admission.   Review of Systems:  Denies CP, dyspnea, palpitation, HA, F, diarrhea, mentation changes. Review of systems is otherwise negative for all not mentioned in HPI.   PMHx Past Medical History:  Diagnosis Date  . Abdominal pain on and off for 2 months  . Abdominal swelling   . History of rectal bleeding    for 1 week  . Hyperlipidemia    no meds    PSHx Past Surgical History:  Procedure Laterality Date  . broken arm     right arm  . rectal fistula      SocHx  reports that he quit smoking about 6 years ago. His smoking use included cigarettes. He has never used smokeless tobacco. He reports that he does not drink alcohol and does not use drugs.  Allergies  Allergen Reactions  . Penicillins Other (See Comments)    Childhood reaction    FamHx Family History  Problem Relation Age of Onset  . Esophageal cancer Father     Prior to Admission medications   Medication Sig Start Date End Date  Taking? Authorizing Provider  clindamycin (CLEOCIN) 150 MG capsule Take 1 capsule (150 mg total) by mouth every 6 (six) hours. Patient not taking: Reported on 08/29/2020 02/12/15   Marlon Pel, PA-C  Omega-3 Fatty Acids (FISH OIL PO) Take 1 tablet by mouth daily. Patient not taking: Reported on 08/29/2020    [provider]  traMADol (ULTRAM) 50 MG tablet Take 1 tablet (50 mg total) by mouth every 6 (six) hours as needed. Patient not taking: Reported on 08/29/2020 02/12/15   Marlon Pel, PA-C  traMADol (ULTRAM) 50 MG tablet Take 1 tablet (50 mg total) by mouth every 6 (six) hours as needed. 08/29/20   Benjiman Core, MD    Physical Exam: Vitals:   09/05/20 0950 09/05/20 0951 09/05/20 1130 09/05/20 1245  BP:  (!) 144/86 131/78 132/87  Pulse:  94 88 79  Resp:  19 20 20   Temp:  98 F (36.7 C)    TempSrc:  Oral    SpO2:  98% 93% 97%  Weight: 72.6 kg     Height: 5\' 7"  (1.702 m)       General: 65 y.o. male resting in bed in NAD Eyes: PERRL, normal sclera ENMT: Nares patent w/o discharge, orophaynx clear, dentition normal, ears w/o discharge/lesions/ulcers Neck: Supple, trachea midline Cardiovascular: RRR, +S1, S2, no m/g/r, equal pulses throughout Respiratory: CTABL, no  w/r/r, normal WOB GI: BS+, distended, TTP LLQ/RLQ, no masses noted, no organomegaly noted MSK: No e/c/c Skin: No rashes, bruises, ulcerations noted Neuro: A&O x 3, no focal deficits Psyc: Appropriate interaction and affect, calm/cooperative  Labs on Admission: I have personally reviewed following labs and imaging studies  CBC: Recent Labs  Lab 09/05/20 1131  WBC 11.1*  NEUTROABS 9.3*  HGB 13.2  HCT 38.5*  MCV 90.8  PLT [id number]   Basic Metabolic Panel: Recent Labs  Lab 09/05/20 1131  NA 123*  K 4.6  CL 88*  CO2 24  GLUCOSE 108*  BUN 23  CREATININE 0.88  CALCIUM 8.1*   GFR: Estimated Creatinine Clearance: 81.4 mL/min (by C-G formula based on SCr of 0.88 mg/dL). Liver Function  Tests: Recent Labs  Lab 09/05/20 1131  AST 26  ALT 22  ALKPHOS 55  BILITOT 1.1  PROT 6.6  ALBUMIN 3.0*   Recent Labs  Lab 09/05/20 1131  LIPASE 22   No results for input(s): AMMONIA in the last 168 hours. Coagulation Profile: No results for input(s): INR, PROTIME in the last 168 hours. Cardiac Enzymes: No results for input(s): CKTOTAL, CKMB, CKMBINDEX, TROPONINI in the last 168 hours. BNP (last 3 results) No results for input(s): PROBNP in the last 8760 hours. HbA1C: No results for input(s): HGBA1C in the last 72 hours. CBG: No results for input(s): GLUCAP in the last 168 hours. Lipid Profile: No results for input(s): CHOL, HDL, LDLCALC, TRIG, CHOLHDL, LDLDIRECT in the last 72 hours. Thyroid Function Tests: No results for input(s): TSH, T4TOTAL, FREET4, T3FREE, THYROIDAB in the last 72 hours. Anemia Panel: No results for input(s): VITAMINB12, FOLATE, FERRITIN, TIBC, IRON, RETICCTPCT in the last 72 hours. Urine analysis:    Component Value Date/Time   COLORURINE YELLOW 08/21/2020 Speed 08/21/2020 0651   LABSPEC 1.013 08/21/2020 0651   PHURINE 5.0 08/21/2020 0651   GLUCOSEU NEGATIVE 08/21/2020 0651   HGBUR NEGATIVE 08/21/2020 0651   BILIRUBINUR NEGATIVE 08/21/2020 0651   KETONESUR NEGATIVE 08/21/2020 0651   PROTEINUR NEGATIVE 08/21/2020 0651   UROBILINOGEN 0.2 02/12/2015 1224   NITRITE NEGATIVE 08/21/2020 0651   LEUKOCYTESUR TRACE (A) 08/21/2020 0651    Radiological Exams on Admission: DG Abdomen Acute W/Chest  Result Date: 09/05/2020 CLINICAL DATA:  Nausea, vomiting, loss of appetite and abdominal distension developing over the past few days. EXAM: DG ABDOMEN ACUTE WITH 1 VIEW CHEST COMPARISON:  PA and lateral chest 04/23/2028. CT abdomen and pelvis 08/21/2020. FINDINGS: Single-view of the chest demonstrates subsegmental atelectasis in the lower lung zones. No pneumothorax or pleural fluid. Heart size is normal. Aortic atherosclerosis. Two views  of the abdomen show no free intraperitoneal air. The bowel gas pattern is nonobstructive. No acute or focal bony abnormality. IMPRESSION: No acute abnormality chest or abdomen. Electronically Signed   By: Inge Rise M.D.   On: 09/05/2020 11:26   Assessment/Plan Abdominal pain N/V Anorexia Hx of pancreatic cancer     - admit to inpatient, med-surg     - fluids, pain control     - Ascites noted on exam, ED consulted IR for paracentesis, 6.1L removed; labs sent     - give albumin     - holding on abx for right now; he has a PCN allergy and no history of rocephin use; can use cipro if need be, but is not showing infection so holding abx for now     - Have spoke with GI, they will review     -  Onco notified that patient is here, awaiting recs  Hyponatremia     - fluids, follow q6h BMP  Lactic acidosis     - mild; give fluids, follow  DVT prophylaxis: SCDs  Code Status: FULL  Family Communication: With wife at bedside.   Consults called: EDP called IR.    Status is: Inpatient  Remains inpatient appropriate because:IV treatments appropriate due to intensity of illness or inability to take PO   Dispo: The patient is from: Home              Anticipated d/c is to: Home              Anticipated d/c date is: 2 days              Patient currently is not medically stable to d/c.  [name] DO [facility]  If 7PM-7AM, please contact night-coverage [email or web address]  09/05/2020, 1:18 PM

## 2020-09-05 NOTE — ED Notes (Signed)
Admission provider at bedside

## 2020-09-05 NOTE — Plan of Care (Signed)

## 2020-09-05 NOTE — Progress Notes (Signed)
Patient's daughter calls stating he has not eaten in 4 days, not drinking, vomiting, having a lot of pain.  She was seeking advice on what to do.  I have told her to take him to [facility] ED for evaluation.  Dr. Clelia Croft has been made aware.

## 2020-09-05 NOTE — Progress Notes (Addendum)
Consultation  Referring Provider:   Dr. Marylyn Ishihara   Primary Care Physician:  System, Provider Not In Primary Gastroenterologist: Dr. Hilarie Fredrickson        Reason for Consultation:  Presumed pancreatic cancer , abdominal distention and pain with nausea and vomiting         HPI:   Jesse Frye is a 65 y.o. male with a past medical history as listed below, who presented to the ER today with abdominal pain.  Patient is Spanish-speaking primarily but declines interpreter and rather speak [place].    Today, the patient again declines an interpreter, he describes that he has had abdominal pain which was mainly in the left side of his abdomen until yesterday and now this morning more towards the right side "where they took the fluid out" and swelling for the last 5 days with a decrease in appetite as well as some nausea and vomiting.  Explains that he has not really eaten anything in the past 2 days at all and prior to this was just drinking some fluids.  Also describes he has not been passing bowel movements for the past 5 days, but is passing a small amount of gas.    Denies fever, chills or weight loss.  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 Rush Landmark.  01/17/2014 colonoscopy Dr. Hilarie Fredrickson: 1. Normal mucosa in the terminal ileum 2. Five sessile polyps ranging between 3-58mm in size were found in the transverse colon, descending colon, and sigmoid colon; Polypectomy was performed using cold snare 3. Mild diverticulosis was noted in the ascending colon, descending colon, and sigmoid colon  Pathology-mix of tubular adenomas and hyperplastic polyps, repeat was recommended in 5 years  Past  Medical History:  Diagnosis Date  . Abdominal pain on and off for 2 months  . Abdominal swelling   . History of rectal bleeding    for 1 week  . Hyperlipidemia    no meds    Past Surgical History:  Procedure Laterality Date  . broken arm     right arm  . rectal fistula      Family History  Problem Relation Age of Onset  . Esophageal cancer Father      Social History   Tobacco Use  . Smoking status: Former Smoker    Types: Cigarettes    Quit date: 08/29/2014    Years since quitting: 6.0  . Smokeless tobacco: Never Used  . Tobacco comment: Pt used to smoke 4-6 cigarettes day but has now quit as of 7 years ago  Substance Use Topics  . Alcohol use: No  . Drug use: No    Prior to Admission medications   Medication Sig Start Date End Date Taking? Authorizing Provider  traMADol (ULTRAM) 50 MG tablet Take 1 tablet (50 mg total) by mouth every 6 (six) hours as needed. Patient not taking: No sig reported 02/12/15   Delos Haring, PA-C  traMADol (ULTRAM) 50 MG tablet Take 1 tablet (50 mg total) by mouth every 6 (six) hours as needed. Patient not taking: Reported on 09/05/2020 08/29/20   Wyatt Portela, MD    Current  Facility-Administered Medications  Medication Dose Route Frequency Provider Last Rate Last Admin  . lidocaine (XYLOCAINE) 1 % (with pres) injection             Allergies as of 09/05/2020 - Review Complete 09/05/2020  Allergen Reaction Noted  . Penicillins Other (See Comments) and Shortness Of Breath 08/15/2013     Review of Systems:    Constitutional: No weight loss, fever or chills Skin: No rash Cardiovascular: No chest pain Respiratory: No SOB  Gastrointestinal: See HPI and otherwise negative Genitourinary: No dysuria  Neurological: No headache, dizziness or syncope Musculoskeletal: No new muscle or joint pain Hematologic: No bleeding Psychiatric: No history of depression or anxiety    Physical Exam:  Vital signs in last 24 hours: Temp:  [98 F  (36.7 C)-98.1 F (36.7 C)] 98.1 F (36.7 C) (01/05 1422) Pulse Rate:  [79-94] 84 (01/05 1422) Resp:  [12-20] 12 (01/05 1422) BP: (131-148)/(78-94) 138/90 (01/05 1540) SpO2:  [93 %-98 %] 96 % (01/05 1422) Weight:  [72.6 kg] 72.6 kg (01/05 0950)   General:   Pleasant Hispanic male appears to be in NAD, Well developed, Well nourished, alert and cooperative Head:  Normocephalic and atraumatic. Eyes:   PEERL, EOMI. No icterus. Conjunctiva pink. Ears:  Normal auditory acuity. Neck:  Supple Throat: Oral cavity and pharynx without inflammation, swelling or lesion.  Lungs: Respirations even and unlabored. Lungs clear to auscultation bilaterally.   No wheezes, crackles, or rhonchi.  Heart: Normal S1, S2. No MRG. Regular rate and rhythm. No peripheral edema, cyanosis or pallor.  Abdomen:  Soft, Distended, mild generalized ttp. No rebound or guarding. Normal bowel sounds. No appreciable masses or hepatomegaly. Rectal:  Not performed.  Msk:  Symmetrical without gross deformities. Peripheral pulses intact.  Extremities:  Without edema, no deformity or joint abnormality.  Neurologic:  Alert and  oriented x4;  grossly normal neurologically.  Skin:   Dry and intact without significant lesions or rashes. Psychiatric: Demonstrates good judgement and reason without abnormal affect or behaviors.  LAB RESULTS: Recent Labs    09/05/20 1131  WBC 11.1*  HGB 13.2  HCT 38.5*  PLT 400   BMET Recent Labs    09/05/20 1131  NA 123*  K 4.6  CL 88*  CO2 24  GLUCOSE 108*  BUN 23  CREATININE 0.88  CALCIUM 8.1*   LFT Recent Labs    09/05/20 1131 09/05/20 1446  PROT 6.6 6.3*  ALBUMIN 3.0*  --   AST 26  --   ALT 22  --   ALKPHOS 55  --   BILITOT 1.1  --    STUDIES: DG Abdomen Acute W/Chest  Result Date: 09/05/2020 CLINICAL DATA:  Nausea, vomiting, loss of appetite and abdominal distension developing over the past few days. EXAM: DG ABDOMEN ACUTE WITH 1 VIEW CHEST COMPARISON:  PA and lateral  chest 04/23/2028. CT abdomen and pelvis 08/21/2020. FINDINGS: Single-view of the chest demonstrates subsegmental atelectasis in the lower lung zones. No pneumothorax or pleural fluid. Heart size is normal. Aortic atherosclerosis. Two views of the abdomen show no free intraperitoneal air. The bowel gas pattern is nonobstructive. No acute or focal bony abnormality. IMPRESSION: No acute abnormality chest or abdomen. Electronically Signed   By: Inge Rise M.D.   On: 09/05/2020 11:26   EXAM: CT ABDOMEN AND PELVIS WITH CONTRAST  TECHNIQUE: Multidetector CT imaging of the abdomen and pelvis was performed using the standard protocol following bolus administration of intravenous contrast.  CONTRAST:  128mL OMNIPAQUE  IOHEXOL 300 MG/ML  SOLN  COMPARISON:  None  FINDINGS: Lower chest: No consolidation.  No pleural effusion.  Hepatobiliary: Heterogeneous appearance of the liver which in general displays low attenuation. Periportal enhancement and some areas of low attenuation extending into the peripheral liver substance more so in posterior division RIGHT portal distribution than in other areas but also in anterior RIGHT. No discrete hepatic lesion aside from a small hypervascular focus in the RIGHT hepatic lobe on image 20 of series 3 that measures 7 mm. Suspected transient hepatic attenuation difference along the anterolateral segment of the LEFT hepatic lobe. No pericholecystic stranding. No biliary duct dilation.  Portal vein is highly narrowed at the SMV/portal confluence with an area of low attenuation in the lumen compatible with portal thrombus.  Pancreas: Large heterogeneous masslike area in the neck and head of the pancreas anteriorly measuring 4.8 x 2.6 cm. No signs of significant pancreatic ductal dilation.  Infiltration into the transverse mesocolon, soft tissue and nodularity extending into the transverse mesocolon on image 28 of series 3. Also with soft tissue  density extending into the hepatic gastric recess of the lesser sac, this area measuring approximately 4.3 x 4.1 cm.  No sign of gas in the lesser sac.  Spleen: Normal  Adrenals/Urinary Tract: Adrenal glands are normal.  Symmetric renal enhancement. No hydronephrosis. Urinary bladder is smooth in terms of contour. No focal abnormality  Stomach/Bowel: Perigastric stranding.  Some gastric wall thickening.  Small bowel with stranding in the area of the ligament of Treitz. Masslike area with low attenuation just above the fourth portion of the duodenum best seen on coronal image 77 of series 6 measuring approximately 5.5 cm greatest coronal dimension with 2.6 cm greatest thickness in the axial plane.  Colon with diverticular disease.  Mild mesenteric edema in the LEFT hemiabdomen and small volume ascites in the pelvis.  Vascular/Lymphatic: Calcified and noncalcified atheromatous plaque in the abdominal aorta. No aneurysmal dilation. Soft tissue surrounds the SMA at the root of the small bowel mesentery image 27 of series 3. Hazy stranding contact approximately 360 degrees with soft tissue showing subtle low-density fat plane in some areas but more pronounced stranding and soft tissue on image 28 of series 3 suggests vascular involvement, also associated with suspected portal invasion.  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 Suspected superimposed bland thrombus in the portal vein just above this area. These are best captured on delayed images 21 through 16  Reproductive: Prostate heterogeneous. The no pelvic lymphadenopathy.  Other: No free-air. Infiltration of the omentum with subtle stranding suspicious for peritoneal disease. This is seen along the anterior abdomen best seen on images 31 of series 3 just below the liver and gallbladder fossa. Also associated with ascites in  the pelvis, small volume.  Musculoskeletal: Subacute fracture of RIGHT seventh rib. No acute bone finding or signs of destructive bone process.  IMPRESSION: 1. Heterogeneous masslike area in the head of the pancreas with findings that are suspicious for extensive local tumor involvement, nodal disease and peritoneal disease. 2. 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 3. Mild mesenteric edema versus tumor infiltration in the LEFT upper quadrant. Correlation with lactate may be helpful in this patient with near occlusion of the SMV. Some collateral pathways are present but a small amount of superimposed acute thrombosis seen on today's study could pose a  hazard in this patient. 4. Subtle focus of arterial phase enhancement along the hepatic margin raising the question of hepatic metastatic disease. After resolution of acute symptoms would consider liver MR, preferably when the patient is able to cooperate with breath hold instructions, perhaps as an outpatient. 5. Hazy stranding contact about the SMA which on coronal images shows a fat plane but with extensive involvement with soft tissue at the root of the small bowel mesentery and frank invasion of the SMV as described. 6. Subacute fracture of RIGHT seventh rib. 7. Small volume ascites in the pelvis. 8. Aortic atherosclerosis.  A call is out to the referring provider to further discuss findings in the above case.  Aortic Atherosclerosis (ICD10-I70.0).  Electronically Signed: By: Donzetta Kohut M.D. On: 08/21/2020 12:00  CT 09/02/2020 at [facility]: Impression Performed by [facility] RAD  --No evidence of bowel obstruction as clinically questioned.   --Heterogeneous soft tissue mass centered in the pancreatic head/neck with involvement of the stomach, small bowel, omentum, and regional lymph nodes. Diffuse peritoneal  carcinomatosis.   --Ill-defined hypoattenuating focus in hepatic segment 6 which may represent metastasis versus transient hepatic attenuation difference.   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 Small venous collaterals in the upper abdomen. Thrombus seen within more distal branches of the SMV as above.  --Agree with all remaining findings above.  Narrative Performed by [facility] RAD This result has an attachment that is not available.  EXAM: CT ABDOMEN PELVIS W CONTRAST  DATE: 09/02/2020 3:35 AM  ACCESSION: [id number]  DICTATED: 09/02/2020 4:02 AM  INTERPRETATION LOCATION: [facility]   CLINICAL INDICATION: pancreatic mass, severe abdominal pain, c/f obstruction    COMPARISON: None   TECHNIQUE: A spiral CT scan of the abdomen and pelvis was obtained with IV contrast from the lung bases through the pubic symphysis. Images were reconstructed in the axial plane. Coronal and sagittal reformatted images were also provided for further evaluation.   FINDINGS:   LOWER THORAX: Lung bases clear.   HEPATOBILIARY: Ill-defined hypoattenuating focus in hepatic segment 6 (2:32). The gallbladder is present and otherwise unremarkable. No biliary dilatation.   SPLEEN: Unremarkable.  PANCREAS: Large heterogeneous soft tissue mass arising from the pancreatic head/neck measuring 6.3 x 4.7 x 4.4 cm (2:50, 3:40). Additional large mass lesion extending into the gastric hepatic recess of the lesser sac, measuring approximately 9.4 x 6.6 x 6.5 cm (2:38, 3:36).   ADRENALS: Unremarkable.  KIDNEYS/URETERS: Symmetric nephrograms. No hydronephrosis.   BLADDER: Unremarkable.   PELVIC/REPRODUCTIVE ORGANS: Unremarkable.   GI TRACT: Possible involvement of the gastric antrum (2:41), which is effaced, and fourth portion of the duodenum (2:46) by the above-described mass. Otherwise there are no thick walled or dilated loops of bowel. Colonic diverticulosis.   PERITONEUM/RETROPERITONEUM AND MESENTERY: Diffuse nodularity and soft tissue density throughout the omentum. Large volume ascites.  LYMPH NODES: Hypodense lesions likely representing necrotic lymph nodes. For reference:  -1.1 cm paraesophageal/retrocrural lymph node (3:61)  -2.5 cm node adjacent to aorta and SMA (2:52)   -2.3 cm portacaval necrotic node (2:45)  -Numerous other enlarged/necrotic perigastric/peripancreatic nodes.   VESSELS: The aorta is normal in caliber.  Mild calcified atherosclerotic disease. Severe stenosis of the SMV-PV confluence with  thrombus in the SMV extending into the feeding branches.   BONES AND SOFT TISSUES: Healing subacute fracture of the anterior right seventh rib. Tiny residual ribs versus nonfused transverse processes at L1. Small right greater than left fat-containing inguinal hernias. Otherwise soft tissues are unremarkable.  Addendum  Addendum by Janann August, MD on 09/02/2020  2:14 PM EST  ADDENDUM: Small lucent and sclerotic foci adjacent to the right SI joint  favored to be degenerative. Exam End: 09/02/20  3:35 AM   Specimen Collected: 09/02/20  2:13 PM Last Resulted: 09/02/20  2:13 PM  Received From: [place]  Result Received: 09/05/20  8:52 AM       Impression / Plan:   Impression: 1.  Abnormal CT of the abdomen: Initially 12/21, repeat CT abdomen pelvis at [facility] on 09/02/2020 continues to show pancreatic mass with question of spread to the liver, was initially arranged for EUS and FNA 1/13 with Dr. Rush Landmark, presented to the ER yesterday with increased abdominal pain and distention as well as decreased appetite with nausea and some vomiting over the  past 5 days 2.  Abdominal pain and distention: Likely due to above 3.  Leukocytosis  Plan: 1.  Spoke with Dr. Ardis Hughs who is one of our provider with the ability to do EUS.  After reviewing his chart and situation he recommends that we hold off on EUS as the ascitic fluid drawn off yesterday may show [place] malignant cells and patient may not need to endure a further procedure if we get answers from this.  I tried to explain this to the patient today.  Also called his daughter Stanton Kidney at [phone] and updated her.  This was per patient request. 2.  Patient started on clear liquid diet today, we will see how he tolerates, if he does well can likely be increased as tolerated if no plans for procedure 3.  Appreciate oncology's recommendations 4.  Did speak with patient's daughter.  She alerted me that he has a CT of his chest scheduled for tomorrow.  Explained that this can likely be ordered while he is in the hospital or will be ordered at some point as part of his cancer work-up. 5.  Please await any further recommendations from Dr. Fuller Plan.  Thank you for your kind consultation, we will continue to follow.  Lavone Nian [facility]  09/05/2020, 3:48 PM      Attending Physician Note   I have taken a history, examined the patient and reviewed the chart. I agree with the Advanced Practitioner's note, impression and recommendations.  Pancreatic head/neck mass and a large mass in the lesser sac. Tumor extending to the gastric antrum and fourth portion of the duodenum with suspected hepatic metastases. Multiple necrotic lymph notes, nodularity and soft tissue densities throughout the omentum.  Thrombus / invasion at the confluence of the portal vein and SMV and large volume ascites. He complains of abdominal pain, abdominal distention, fatigue. Ascites cell count 1,000 neutrophils. Ascites albumin not done.   Await ascites cytology Prefer ceftriaxone for SBP however with SOB as PCN allergy can use Cipro.  Ceftriaxone OK'd by pharmacist.  Chest CT ordered for tomorrow Consider CT guided biopsy EUS scheduled for 1/13 if ascites cytology is negative and no option for CT guided biopsy   Lucio Edward, MD [facility] Gastroenter[facility]

## 2020-09-06 ENCOUNTER — Encounter (HOSPITAL_COMMUNITY)
Admission: EM | Disposition: A | Discharge status: Home / Self Care | Payer: Self-pay | Source: Home / Self Care | Attending: Internal Medicine

## 2020-09-06 DIAGNOSIS — R188 Other ascites: Secondary | ICD-10-CM

## 2020-09-06 DIAGNOSIS — C250 Malignant neoplasm of head of pancreas: Secondary | ICD-10-CM

## 2020-09-06 DIAGNOSIS — C259 Malignant neoplasm of pancreas, unspecified: Principal | ICD-10-CM

## 2020-09-06 DIAGNOSIS — E871 Hypo-osmolality and hyponatremia: Secondary | ICD-10-CM

## 2020-09-06 DIAGNOSIS — R1084 Generalized abdominal pain: Secondary | ICD-10-CM

## 2020-09-06 DIAGNOSIS — R627 Adult failure to thrive: Secondary | ICD-10-CM

## 2020-09-06 DIAGNOSIS — R180 Malignant ascites: Secondary | ICD-10-CM

## 2020-09-06 LAB — COMPREHENSIVE METABOLIC PANEL
ALT: 18 U/L (ref 0–44)
AST: 20 U/L (ref 15–41)
Albumin: 2.7 g/dL — ABNORMAL LOW (ref 3.5–5.0)
Alkaline Phosphatase: 45 U/L (ref 38–126)
Anion gap: 11 (ref 5–15)
BUN: 19 mg/dL (ref 8–23)
CO2: 21 mmol/L — ABNORMAL LOW (ref 22–32)
Calcium: 7.7 mg/dL — ABNORMAL LOW (ref 8.9–10.3)
Chloride: 95 mmol/L — ABNORMAL LOW (ref 98–111)
Creatinine, Ser: 0.73 mg/dL (ref 0.61–1.24)
GFR, Estimated: >60 mL/min (ref >60)
Glucose, Bld: 69 mg/dL — ABNORMAL LOW (ref 70–99)
Potassium: 4.1 mmol/L (ref 3.5–5.1)
Sodium: 127 mmol/L — ABNORMAL LOW (ref 135–145)
Total Bilirubin: 1.3 mg/dL — ABNORMAL HIGH (ref 0.3–1.2)
Total Protein: 5.9 g/dL — ABNORMAL LOW (ref 6.5–8.1)

## 2020-09-06 LAB — CBC
HCT: 37.7 % — ABNORMAL LOW (ref 39.0–52.0)
Hemoglobin: 12.8 g/dL — ABNORMAL LOW (ref 13.0–17.0)
MCH: 31.6 pg (ref 26.0–34.0)
MCHC: 34 g/dL (ref 30.0–36.0)
MCV: 93.1 fL (ref 80.0–100.0)
Platelets: 390 10*3/uL (ref 150–400)
RBC: 4.05 MIL/uL — ABNORMAL LOW (ref 4.22–5.81)
RDW: 12.6 % (ref 11.5–15.5)
WBC: 10.4 10*3/uL (ref 4.0–10.5)
nRBC: 0 % (ref 0.0–0.2)

## 2020-09-06 LAB — SODIUM, URINE, RANDOM: Sodium, Ur: <10 mmol/L

## 2020-09-06 LAB — SURGICAL PCR SCREEN
MRSA, PCR: NEGATIVE
Staphylococcus aureus: NEGATIVE

## 2020-09-06 SURGERY — ULTRASOUND, UPPER GI TRACT, ENDOSCOPIC
Anesthesia: Monitor Anesthesia Care

## 2020-09-06 MED ORDER — CIPROFLOXACIN IN D5W 400 MG/200ML IV SOLN: 400.0000 mg | Freq: Two times a day (BID) | INTRAVENOUS | Status: DC

## 2020-09-06 MED ORDER — SODIUM CHLORIDE 0.9 % IV SOLN: INTRAVENOUS | Status: DC

## 2020-09-06 MED ORDER — SODIUM CHLORIDE 0.9 % IV SOLN
2.0000 g | INTRAVENOUS | Status: DC
  Administered 2020-09-06 – 2020-09-08 (×3): 2 g via INTRAVENOUS
  Filled 2020-09-06 (×2): qty 2
  Filled 2020-09-06: qty 20
  Filled 2020-09-06: qty 2

## 2020-09-06 NOTE — Progress Notes (Addendum)
PROGRESS NOTE    Jesse Frye  KMM:[id number] DOB: 10-26-1955 DOA: 09/05/2020 PCP: System, Provider Not In    Chief Complaint  Patient presents with  . Abdominal Pain    Brief Narrative:   65 year old Hispanic gentleman has no significant past medical history recently developed abdominal pain was seen in the ED on December 21 while CT abdomen showed "a heterogeneous mass in the head of the pancreas with findings suspicious for extent of local tumor involvement and nodal disease as well as peritoneal disease" He is in the process of getting cancer work-up outpatient, however developed abdominal distention, intractable nausea and vomiting, He says that over the last 4 days, he's had N/V which has increased to the point that he is not able to eat, he presented to [facility] long ED, found to have hyponatremia, ascites, paracentesis ordered in the ED, GI consulted , oncology notified ,patient is admitted to [facility]  Subjective:  Reports some mild right lower ab pain, no n/v, wants to eat No fever, reports no bm for 5 days, reports feeling very weak Family at bedside   Assessment & Plan:   Active Problems:   Intractable nausea and vomiting   Ascites, likely malignant in nature, cytology pending -Status post paracentesis removed 6.1 L, fluid study total nucleated cells 2344, Gram stain no organisms seen, culture no growth so far -GI recommended Rocephin for now due to elevated WBC and ascites fluids -We will follow-up on cytology result  Pancreatic mass , 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 with high-grade narrowing, 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,  Pancreatic mass is unresectable, not amiable to IR biopsy Oncology recommended CA 19-9, result pending Ascites cytology pending GI ordered CT chest with contrast surgery for possible biopsy site, if no biopsy site available, may need to proceed with EUS biopsy We will follow oncology  and GI recommendation  Nausea and vomiting None since in the hospital, as needed antiemetics Diet per GI  Hyponatremia,  Likely multifactorial including dehydration, but does has ascites, albumin 2.7 Sodium 123 on presentation, improved to 127 on IV fluids He is started on a diet today per GI, will hold IV fluids for now check urine sodium and osmole  Subacute fracture of RIGHT seventh rib, incidental finding on CT scan Denies chest pain, encourage incentive spirometer  FTT/deconditioning; will get PT eval   DVT prophylaxis: SCDs Start: 09/05/20 1631   Code Status: Full Family Communication: Family at bedside with his permission, family does not speak English, patient declined interpreter, patient prefers to speak English  Disposition:   Status is: Inpatient  Dispo: The patient is from: Home              Anticipated d/c is to: Pending PT eval              Anticipated d/c date is: To be determined                 Consultants:   GI  IR  oncology  Procedures:   paracentesis  Antimicrobials:   Rocephin from general 6     Objective: Vitals:   09/05/20 2138 09/06/20 0157 09/06/20 0635 09/06/20 1017  BP: 138/78 121/72 113/69 125/74  Pulse: 80 84 86 79  Resp: 18 18 18 16   Temp: 98.7 F (37.1 C) 98.6 F (37 C) 98.3 F (36.8 C) 98.5 F (36.9 C)  TempSrc: Oral Oral Oral Oral  SpO2: 96% 98%  97% 97%  Weight:      Height:        Intake/Output Summary (Last 24 hours) at 09/06/2020 1058 Last data filed at 09/06/2020 1017 Gross per 24 hour  Intake 956.93 ml  Output 2150 ml  Net -1193.07 ml   Filed Weights   09/05/20 0950  Weight: 72.6 kg    Examination:  General exam: frail, weak, NAD Respiratory system: Clear to auscultation. Respiratory effort normal. Cardiovascular system: S1 & S2 heard, RRR. No pedal edema. Gastrointestinal system: Abdomen is distended, soft , slight right lower quandrant tenderness, no rebound. Normal bowel sounds heard. Central  nervous system: Alert and oriented. No focal neurological deficits. Extremities: generalized weakness Skin: No rashes, lesions or ulcers Psychiatry: Judgement and insight appear normal. Mood & affect appropriate.     Data Reviewed: I have personally reviewed following labs and imaging studies  CBC: Recent Labs  Lab 09/05/20 1131 09/06/20 0608  WBC 11.1* 10.4  NEUTROABS 9.3*  --   HGB 13.2 12.8*  HCT 38.5* 37.7*  MCV 90.8 93.1  PLT 400 390    Basic Metabolic Panel: Recent Labs  Lab 09/05/20 1131 09/05/20 1738 09/05/20 2304 09/06/20 0608  NA 123* 127* 127* 127*  K 4.6 4.7 4.3 4.1  CL 88* 92* 93* 95*  CO2 24 25 23  21*  GLUCOSE 108* 82 70 69*  BUN 23 20 18 19   CREATININE 0.88 0.77 0.78 0.73  CALCIUM 8.1* 7.8* 7.9* 7.7*    GFR: Estimated Creatinine Clearance: 89.5 mL/min (by C-G formula based on SCr of 0.73 mg/dL).  Liver Function Tests: Recent Labs  Lab 09/05/20 1131 09/05/20 1446 09/06/20 0608  AST 26  --  20  ALT 22  --  18  ALKPHOS 55  --  45  BILITOT 1.1  --  1.3*  PROT 6.6 6.3* 5.9*  ALBUMIN 3.0*  --  2.7*    CBG: No results for input(s): GLUCAP in the last 168 hours.   Recent Results (from the past 240 hour(s))  Resp Panel by RT-PCR (Flu A&B, Covid) Nasopharyngeal Swab     Status: None   Collection Time: 09/05/20 11:31 AM   Specimen: Nasopharyngeal Swab; Nasopharyngeal(NP) swabs in vial transport medium  Result Value Ref Range Status   SARS Coronavirus 2 by RT PCR NEGATIVE NEGATIVE Final    Comment: (NOTE) SARS-CoV-2 target nucleic acids are NOT DETECTED.  The SARS-CoV-2 RNA is generally detectable in upper respiratory specimens during the acute phase of infection. The lowest concentration of SARS-CoV-2 viral copies this assay can detect is 138 copies/mL. A negative result does not preclude SARS-Cov-2 infection and should not be used as the sole basis for treatment or other patient management decisions. A negative result may occur with   improper specimen collection/handling, submission of specimen other than nasopharyngeal swab, presence of viral mutation(s) within the areas targeted by this assay, and inadequate number of viral copies(<138 copies/mL). A negative result must be combined with clinical observations, patient history, and epidemiological information. The expected result is Negative.  Fact Sheet for Patients:  11/04/20  Fact Sheet for Healthcare Providers:  11/03/20  This test is no t yet approved or cleared by the [employer] FDA and  has been authorized for detection and/or diagnosis of SARS-CoV-2 by FDA under an Emergency Use Authorization (EUA). This EUA will remain  in effect (meaning this test can be used) for the duration of the COVID-19 declaration under Section 564(b)(1) of the Act, 21 U.S.C.section 360bbb-3(b)(1), unless the  authorization is terminated  or revoked sooner.       Influenza A by PCR NEGATIVE NEGATIVE Final   Influenza B by PCR NEGATIVE NEGATIVE Final    Comment: (NOTE) The Xpert Xpress SARS-CoV-2/FLU/RSV plus assay is intended as an aid in the diagnosis of influenza from Nasopharyngeal swab specimens and should not be used as a sole basis for treatment. Nasal washings and aspirates are unacceptable for Xpert Xpress SARS-CoV-2/FLU/RSV testing.  Fact Sheet for Patients: EntrepreneurPulse.com.au  Fact Sheet for Healthcare Providers: IncredibleEmployment.be  This test is not yet approved or cleared by the [place] FDA and has been authorized for detection and/or diagnosis of SARS-CoV-2 by FDA under an Emergency Use Authorization (EUA). This EUA will remain in effect (meaning this test can be used) for the duration of the COVID-19 declaration under Section 564(b)(1) of the Act, 21 U.S.C. section 360bbb-3(b)(1), unless the authorization is terminated  or revoked.  Performed at [facility], [place] [address], [place], [place] [address]   Body fluid culture     Status: None (Preliminary result)   Collection Time: 09/05/20  3:42 PM   Specimen: PATH Cytology Peritoneal fluid  Result Value Ref Range Status   Specimen Description   Final    PERITONEAL Performed at [facility] [address], [place], [place] [address]    Special Requests   Final    NONE Performed at [facility], [place] [address], [place], [place] [address]    Gram Stain   Final    ABUNDANT WBC PRESENT, PREDOMINANTLY MONONUCLEAR NO ORGANISMS SEEN    Culture   Final    NO GROWTH < 12 HOURS Performed at [facility] [address] [place], [place] [address]    Report Status PENDING  Incomplete  Surgical pcr screen     Status: None   Collection Time: 09/06/20  5:30 AM   Specimen: Nasal Mucosa; Nasal Swab  Result Value Ref Range Status   MRSA, PCR NEGATIVE NEGATIVE Final   Staphylococcus aureus NEGATIVE NEGATIVE Final    Comment: (NOTE) The Xpert SA Assay (FDA approved for NASAL specimens in patients 83 years of age and older), is one component of a comprehensive surveillance program. It is not intended to diagnose infection nor to guide or monitor treatment. Performed at [facility], [place] [address] [place], [place] [address]          Radiology Studies: US Paracentesis  Result Date: 09/05/2020 INDICATION: Patient with history of abdominal pain, nausea, vomiting, abdominal distension/ascites, pancreatic mass; request received for diagnostic and therapeutic paracentesis. EXAM: ULTRASOUND GUIDED DIAGNOSTIC AND THERAPEUTIC PARACENTESIS MEDICATIONS: 1% lidocaine to skin and subcutaneous tissue COMPLICATIONS: None immediate. PROCEDURE: Informed written consent was obtained from the patient after a discussion of the risks, benefits and alternatives to treatment. A timeout was  performed prior to the initiation of the procedure. Initial ultrasound scanning demonstrates a large amount of ascites within the right lower abdominal quadrant. The right lower abdomen was prepped and draped in the usual sterile fashion. 1% lidocaine was used for local anesthesia. Following this, a 19 gauge, 10-cm, Yueh catheter was introduced. An ultrasound image was saved for documentation purposes. The paracentesis was performed. The catheter was removed and a dressing was applied. The patient tolerated the procedure well without immediate post procedural complication. FINDINGS: A total of approximately 6.1 liters of hazy, slightly blood tinged/amber fluid was removed. Samples were sent to the laboratory as requested by the  clinical team. IMPRESSION: Successful ultrasound-guided diagnostic and therapeutic paracentesis yielding 6.1 liters of peritoneal fluid. Read by: Rowe Robert, PA-C Electronically Signed   By: Aletta Edouard M.D.   On: 09/05/2020 16:21   DG Abdomen Acute W/Chest  Result Date: 09/05/2020 CLINICAL DATA:  Nausea, vomiting, loss of appetite and abdominal distension developing over the past few days. EXAM: DG ABDOMEN ACUTE WITH 1 VIEW CHEST COMPARISON:  PA and lateral chest 04/23/2028. CT abdomen and pelvis 08/21/2020. FINDINGS: Single-view of the chest demonstrates subsegmental atelectasis in the lower lung zones. No pneumothorax or pleural fluid. Heart size is normal. Aortic atherosclerosis. Two views of the abdomen show no free intraperitoneal air. The bowel gas pattern is nonobstructive. No acute or focal bony abnormality. IMPRESSION: No acute abnormality chest or abdomen. Electronically Signed   By: Inge Rise M.D.   On: 09/05/2020 11:26     LOS: 1 day   Time spent: 67mins Greater than 50% of this time was spent in counseling, explanation of diagnosis, planning of further management, and coordination of care.  I have personally reviewed and interpreted on  09/06/2020 daily  labs,I reviewed all nursing notes, pharmacy notes, consultant notes,  vitals, pertinent old records  I have discussed plan of care as described above with RN , patient and family on 09/06/2020  Voice Recognition /Dragon dictation system was used to create this note, attempts have been made to correct errors. Please contact the author with questions and/or clarifications.   Florencia Reasons, MD PhD FACP [facility]  Available via Epic secure chat 7am-7pm for nonurgent issues Please page for urgent issues To page the attending provider between 7A-7P or the covering provider during after hours 7P-7A, please log into the web site [email or web address] and access using universal [place] password for that web site. If you do not have the password, please call the hospital operator.    09/06/2020, 10:58 AM

## 2020-09-06 NOTE — Progress Notes (Signed)
Events in the last 24 hours noted.  [name] was seen in the emergency department after presenting with abdominal pain and found to have ascites.  Paracentesis performed with the cytology currently pending.  We are still looking for pathological documentation of his malignancy.  Percutaneous biopsy was not feasible given the location of his tumor.  If cytology is nondiagnostic, endoscopic evaluation would be his best approach.  I appreciate input from Dr. Christella Hartigan and gastroenterology.  For the time being, I agree with the supportive management approach.  I recommend obtaining CA 19-9 which could be a helpful marker although not very specific.  Upon confirmation of his cancer diagnosis, treatment can commence, as I have discussed with him, as an outpatient.  We will continue to follow his progress.

## 2020-09-07 ENCOUNTER — Ambulatory Visit (HOSPITAL_COMMUNITY): Admission: RE | Admit: 2020-09-07 | Payer: Self-pay | Source: Ambulatory Visit

## 2020-09-07 ENCOUNTER — Inpatient Hospital Stay (HOSPITAL_COMMUNITY): Payer: Self-pay

## 2020-09-07 DIAGNOSIS — R1084 Generalized abdominal pain: Secondary | ICD-10-CM

## 2020-09-07 HISTORY — PX: IR PARACENTESIS: IMG2679

## 2020-09-07 HISTORY — PX: IR IMAGING GUIDED PORT INSERTION: IMG5740

## 2020-09-07 LAB — COMPREHENSIVE METABOLIC PANEL
ALT: 18 U/L (ref 0–44)
AST: 21 U/L (ref 15–41)
Albumin: 2.8 g/dL — ABNORMAL LOW (ref 3.5–5.0)
Alkaline Phosphatase: 52 U/L (ref 38–126)
Anion gap: 10 (ref 5–15)
BUN: 20 mg/dL (ref 8–23)
CO2: 25 mmol/L (ref 22–32)
Calcium: 8.3 mg/dL — ABNORMAL LOW (ref 8.9–10.3)
Chloride: 96 mmol/L — ABNORMAL LOW (ref 98–111)
Creatinine, Ser: 0.85 mg/dL (ref 0.61–1.24)
GFR, Estimated: >60 mL/min (ref >60)
Glucose, Bld: 127 mg/dL — ABNORMAL HIGH (ref 70–99)
Potassium: 4.5 mmol/L (ref 3.5–5.1)
Sodium: 131 mmol/L — ABNORMAL LOW (ref 135–145)
Total Bilirubin: 0.8 mg/dL (ref 0.3–1.2)
Total Protein: 6.2 g/dL — ABNORMAL LOW (ref 6.5–8.1)

## 2020-09-07 LAB — MAGNESIUM: Magnesium: 2.2 mg/dL (ref 1.7–2.4)

## 2020-09-07 LAB — OSMOLALITY: Osmolality: 280 mOsm/kg (ref 275–295)

## 2020-09-07 LAB — TSH: TSH: 1.982 u[IU]/mL (ref 0.350–4.500)

## 2020-09-07 LAB — URIC ACID: Uric Acid, Serum: 6.3 mg/dL (ref 3.7–8.6)

## 2020-09-07 LAB — OSMOLALITY, URINE: Osmolality, Ur: 722 mOsm/kg (ref 300–900)

## 2020-09-07 LAB — CORTISOL: Cortisol, Plasma: 25.6 ug/dL

## 2020-09-07 LAB — CANCER ANTIGEN 19-9: CA 19-9: 68 U/mL — ABNORMAL HIGH (ref 0–35)

## 2020-09-07 MED ORDER — IOHEXOL 300 MG/ML  SOLN
50.0000 mL | Freq: Once | INTRAMUSCULAR | Status: AC | PRN
  Administered 2020-09-07: 10 mL via INTRAVENOUS

## 2020-09-07 MED ORDER — MIDAZOLAM HCL 2 MG/2ML IJ SOLN
INTRAMUSCULAR | Status: AC | PRN
  Administered 2020-09-07: 1 mg via INTRAVENOUS
  Administered 2020-09-07 (×4): 0.5 mg via INTRAVENOUS

## 2020-09-07 MED ORDER — POLYETHYLENE GLYCOL 3350 17 G PO PACK
17.0000 g | PACK | Freq: Every day | ORAL | Status: DC
  Administered 2020-09-08 – 2020-09-10 (×3): 17 g via ORAL
  Filled 2020-09-07 (×3): qty 1

## 2020-09-07 MED ORDER — MIDAZOLAM HCL 2 MG/2ML IJ SOLN
INTRAMUSCULAR | Status: AC
  Filled 2020-09-07: qty 2

## 2020-09-07 MED ORDER — SENNOSIDES-DOCUSATE SODIUM 8.6-50 MG PO TABS
1.0000 | ORAL_TABLET | Freq: Two times a day (BID) | ORAL | Status: DC
  Administered 2020-09-07 – 2020-09-10 (×6): 1 via ORAL
  Filled 2020-09-07 (×6): qty 1

## 2020-09-07 MED ORDER — LIDOCAINE HCL 1 % IJ SOLN
INTRAMUSCULAR | Status: AC
  Filled 2020-09-07: qty 20

## 2020-09-07 MED ORDER — FENTANYL CITRATE (PF) 100 MCG/2ML IJ SOLN
INTRAMUSCULAR | Status: AC
  Filled 2020-09-07: qty 2

## 2020-09-07 MED ORDER — LIDOCAINE HCL (PF) 1 % IJ SOLN
INTRAMUSCULAR | Status: AC | PRN
  Administered 2020-09-07 (×2): 10 mL via INTRADERMAL

## 2020-09-07 MED ORDER — COVID-19 MRNA VACC (MODERNA) 50 MCG/0.25ML IM SUSP: 0.2500 mL | Freq: Once | INTRAMUSCULAR | Status: DC

## 2020-09-07 MED ORDER — IOHEXOL 300 MG/ML  SOLN
75.0000 mL | Freq: Once | INTRAMUSCULAR | Status: AC | PRN
  Administered 2020-09-07: 75 mL via INTRAVENOUS

## 2020-09-07 MED ORDER — LIDOCAINE-EPINEPHRINE 1 %-1:100000 IJ SOLN
INTRAMUSCULAR | Status: AC
  Filled 2020-09-07: qty 1

## 2020-09-07 MED ORDER — FENTANYL CITRATE (PF) 100 MCG/2ML IJ SOLN
INTRAMUSCULAR | Status: AC | PRN
  Administered 2020-09-07: 50 ug via INTRAVENOUS
  Administered 2020-09-07: 25 ug via INTRAVENOUS
  Administered 2020-09-07: 50 ug via INTRAVENOUS

## 2020-09-07 NOTE — Sedation Documentation (Signed)
Paracentesis procedure begun.

## 2020-09-07 NOTE — Progress Notes (Signed)
Referring Physician(s): XIPJAS,N  Supervising Physician: Markus Daft  Patient Status:  [name] - In-pt  Chief Complaint:  Abdominal pain/distension, weakness, likely pancreatic cancer  Subjective: Patient familiar to IR service from recent paracentesis on 09/05/2020. Fluid cytology revealed malignant cells. Overall findings concerning for likely pancreatic cancer. Request now received from oncology for Port-A-Cath placement as well as additional paracentesis. Patient currently denies fever, headache, chest pain, worsening dyspnea, cough, back pain, nausea, vomiting or bleeding. He still appears weak with poor p.o. intake, constipation and minimal voiding. He is afebrile, peritoneal fluid cultures negative to date; CBC normal, hemoglobin 12.8, platelets 390k, creatinine normal, total bilirubin 0.8 PT/INR normal, COVID-19 negative, CA 19-9 68.   Past Medical History:  Diagnosis Date  . Abdominal pain on and off for 2 months  . Abdominal swelling   . History of rectal bleeding    for 1 week  . Hyperlipidemia    no meds   Past Surgical History:  Procedure Laterality Date  . broken arm     right arm  . rectal fistula        Allergies: Penicillins  Medications: Prior to Admission medications   Medication Sig Start Date End Date Taking? Authorizing Provider  traMADol (ULTRAM) 50 MG tablet Take 1 tablet (50 mg total) by mouth every 6 (six) hours as needed. Patient not taking: No sig reported 02/12/15   Delos Haring, PA-C  traMADol (ULTRAM) 50 MG tablet Take 1 tablet (50 mg total) by mouth every 6 (six) hours as needed. Patient not taking: Reported on 09/05/2020 08/29/20   Wyatt Portela, MD     Vital Signs: BP 117/80 (BP Location: Left Arm)   Pulse 90   Temp 98.8 F (37.1 C) (Oral)   Resp 16   Ht 5\' 7"  (1.702 m)   Wt 160 lb (72.6 kg)   SpO2 97%   BMI 25.06 kg/m   Physical Exam patient awake but appears weak/lethargic. Chest clear to auscultation bilaterally. Heart  with regular rate and rhythm. Abdomen slightly distended, few bowel sounds, some mild generalized tenderness to palpation. No lower extremity edema.  Imaging: US Paracentesis  Result Date: 09/05/2020 INDICATION: Patient with history of abdominal pain, nausea, vomiting, abdominal distension/ascites, pancreatic mass; request received for diagnostic and therapeutic paracentesis. EXAM: ULTRASOUND GUIDED DIAGNOSTIC AND THERAPEUTIC PARACENTESIS MEDICATIONS: 1% lidocaine to skin and subcutaneous tissue COMPLICATIONS: None immediate. PROCEDURE: Informed written consent was obtained from the patient after a discussion of the risks, benefits and alternatives to treatment. A timeout was performed prior to the initiation of the procedure. Initial ultrasound scanning demonstrates a large amount of ascites within the right lower abdominal quadrant. The right lower abdomen was prepped and draped in the usual sterile fashion. 1% lidocaine was used for local anesthesia. Following this, a 19 gauge, 10-cm, Yueh catheter was introduced. An ultrasound image was saved for documentation purposes. The paracentesis was performed. The catheter was removed and a dressing was applied. The patient tolerated the procedure well without immediate post procedural complication. FINDINGS: A total of approximately 6.1 liters of hazy, slightly blood tinged/amber fluid was removed. Samples were sent to the laboratory as requested by the clinical team. IMPRESSION: Successful ultrasound-guided diagnostic and therapeutic paracentesis yielding 6.1 liters of peritoneal fluid. Read by: Rowe Robert, PA-C Electronically Signed   By: Aletta Edouard M.D.   On: 09/05/2020 16:21   DG Abdomen Acute W/Chest  Result Date: 09/05/2020 CLINICAL DATA:  Nausea, vomiting, loss of appetite and abdominal distension developing over  the past few days. EXAM: DG ABDOMEN ACUTE WITH 1 VIEW CHEST COMPARISON:  PA and lateral chest 04/23/2028. CT abdomen and pelvis  08/21/2020. FINDINGS: Single-view of the chest demonstrates subsegmental atelectasis in the lower lung zones. No pneumothorax or pleural fluid. Heart size is normal. Aortic atherosclerosis. Two views of the abdomen show no free intraperitoneal air. The bowel gas pattern is nonobstructive. No acute or focal bony abnormality. IMPRESSION: No acute abnormality chest or abdomen. Electronically Signed   By: Inge Rise M.D.   On: 09/05/2020 11:26    Labs:  CBC: Recent Labs    08/21/20 0659 09/05/20 1131 09/06/20 0608  WBC 7.4 11.1* 10.4  HGB 13.1 13.2 12.8*  HCT 41.5 38.5* 37.7*  PLT 271 400 390    COAGS: Recent Labs    09/05/20 1738  INR 1.1    BMP: Recent Labs    09/05/20 1738 09/05/20 2304 09/06/20 0608 09/07/20 0535  NA 127* 127* 127* 131*  K 4.7 4.3 4.1 4.5  CL 92* 93* 95* 96*  CO2 25 23 21* 25  GLUCOSE 82 70 69* 127*  BUN 20 18 19 20   CALCIUM 7.8* 7.9* 7.7* 8.3*  CREATININE 0.77 0.78 0.73 0.85  GFRNONAA >60 >60 >60 >60    LIVER FUNCTION TESTS: Recent Labs    09/05/20 1131 09/05/20 1446 09/06/20 0608 09/07/20 0535  BILITOT 1.1  --  1.3* 0.8  AST 26  --  20 21  ALT 22  --  18 18  ALKPHOS 55  --  45 52  PROT 6.6 6.3* 5.9* 6.2*  ALBUMIN 3.0*  --  2.7* 2.8*    Assessment and Plan: Patient familiar to IR service from recent paracentesis on 09/05/2020. Fluid cytology revealed malignant cells. Overall findings concerning for likely pancreatic cancer. Request now received from oncology for Port-A-Cath placement as well as additional paracentesis.  He is afebrile, peritoneal fluid cultures negative to date; CBC normal, hemoglobin 12.8, platelets 390k, creatinine normal, total bilirubin 0.8 PT/INR normal, COVID-19 negative, CA 19-9 68.Risks and benefits of image guided port-a-catheter placement/paracentesis was discussed with the patient/daughter including, but not limited to bleeding, infection, pneumothorax, or fibrin sheath development and need for additional  procedures.  All of the patient's questions were answered, patient is agreeable to proceed. Consent signed and in chart.     Electronically Signed: D. Rowe Robert, PA-C 09/07/2020, 10:35 AM   I spent a total of 25 minutes at the the patient's bedside AND on the patient's hospital floor or unit, greater than 50% of which was counseling/coordinating care for Port-A-Cath placement/paracentesis    Patient ID: Jesse Frye, male   DOB: 09-30-1955, 65 y.o.   MRN: [id number]

## 2020-09-07 NOTE — Sedation Documentation (Signed)
Patient prepped for port a cath placement.

## 2020-09-07 NOTE — Progress Notes (Addendum)
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  Discussed above with the patient and his daughter.  Patient tells me that he is uncomfortable due to the amount of fluid which is reaccumulated over the past couple of days since paracentesis.  His daughter is concerned as he hardly had anything to eat yesterday and had not eaten for the 5 days before this.  She would like him to get some food back.   Objective   Vital signs in last 24 hours: Temp:  [98 F (36.7 C)-98.8 F (37.1 C)] 98.8 F (37.1 C) (01/06 2133) Pulse Rate:  [79-90] 90 (01/07 0557) Resp:  [15-16] 16 (01/07 0557) BP: (116-139)/(73-80) 117/80 (01/07 0557) SpO2:  [95 %-99 %] 97 % (01/07 0557)   General:   Ill-appearing Hispanic male in NAD Heart:  Regular rate and rhythm; no murmurs Lungs: Respirations even and unlabored, lungs CTA bilaterally Abdomen:  Soft, mild generalized TTP, moderate distention. Normal bowel sounds. Neurologic:  Alert and oriented,  grossly normal neurologically. Psych:  Cooperative. Normal mood and affect.  Intake/Output from previous day: 01/06 0701 - 01/07 0700 In: 460 [P.O.:360; IV Piggyback:100] Out: 400 [Urine:400]  Lab Results: Recent Labs    09/05/20 1131 09/06/20 0608  WBC 11.1* 10.4  HGB 13.2 12.8*  HCT 38.5* 37.7*  PLT 400 390   BMET Recent Labs    09/05/20 2304 09/06/20 0608 09/07/20 0535  NA 127* 127* 131*  K 4.3 4.1 4.5  CL 93* 95* 96*  CO2 23 21* 25  GLUCOSE 70 69* 127*  BUN 18 19 20   CREATININE 0.78 0.73 0.85  CALCIUM 7.9* 7.7* 8.3*   LFT Recent Labs    09/07/20 0535  PROT 6.2*  ALBUMIN 2.8*  AST 21  ALT 18  ALKPHOS 52  BILITOT 0.8   PT/INR Recent Labs    09/05/20 1738  LABPROT 13.6  INR 1.1    Studies/Results: US Paracentesis  Result Date: 09/05/2020 INDICATION: Patient with history of  abdominal pain, nausea, vomiting, abdominal distension/ascites, pancreatic mass; request received for diagnostic and therapeutic paracentesis. EXAM: ULTRASOUND GUIDED DIAGNOSTIC AND THERAPEUTIC PARACENTESIS MEDICATIONS: 1% lidocaine to skin and subcutaneous tissue COMPLICATIONS: None immediate. PROCEDURE: Informed written consent was obtained from the patient after a discussion of the risks, benefits and alternatives to treatment. A timeout was performed prior to the initiation of the procedure. Initial ultrasound scanning demonstrates a large amount of ascites within the right lower abdominal quadrant. The right lower abdomen was prepped and draped in the usual sterile fashion. 1% lidocaine was used for local anesthesia. Following this, a 19 gauge, 10-cm, Yueh catheter was introduced. An ultrasound image was saved for documentation purposes. The paracentesis was performed. The catheter was removed and a dressing was applied. The patient tolerated the procedure well without immediate post procedural complication. FINDINGS: A total of approximately 6.1 liters of hazy, slightly blood tinged/amber fluid was removed. Samples were sent to the laboratory as requested by the clinical team. IMPRESSION: Successful ultrasound-guided diagnostic and therapeutic paracentesis yielding 6.1 liters of peritoneal fluid. Read by: Rowe Robert, PA-C Electronically Signed   By: Aletta Edouard M.D.   On: 09/05/2020 16:21   DG Abdomen Acute W/Chest  Result Date: 09/05/2020 CLINICAL DATA:  Nausea, vomiting, loss of appetite and abdominal distension developing over the past few days. EXAM: DG ABDOMEN ACUTE WITH 1 VIEW  CHEST COMPARISON:  PA and lateral chest 04/23/2028. CT abdomen and pelvis 08/21/2020. FINDINGS: Single-view of the chest demonstrates subsegmental atelectasis in the lower lung zones. No pneumothorax or pleural fluid. Heart size is normal. Aortic atherosclerosis. Two views of the abdomen show no free intraperitoneal air.  The bowel gas pattern is nonobstructive. No acute or focal bony abnormality. IMPRESSION: No acute abnormality chest or abdomen. Electronically Signed   By: Inge Rise M.D.   On: 09/05/2020 11:26    Assessment / Plan:   Assessment: 1.  Abnormal CT of the abdomen: Initially 12/21, repeat CT abdomen pelvis 09/02/2020 continues to show pancreatic mass with question of spread to the liver, cytology from ascitic fluid came back malignant this morning, no need for further work-up on our end 2.  Abdominal pain and distention  Plan: 1.  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  Due to this we do not need to proceed with EUS with FNA.  We will go ahead and cancel patient's scheduled procedure with Dr. Rush Landmark. 2.  Spoke with the patient and his daughter this morning in regards to results and what this means going forward.  They will know more once they speak with the oncology team as far as managing his cancer going forward. 3.  Daughter does request that the patient be allowed to eat today as he has not eaten in the past 6 days.  I explained that he did have a clear liquid diet available yesterday but just chose not to eat very much.  He can certainly eat today however he would like after paracentesis. 4.  Discussed paracentesis with Dr. Erlinda Hong, she will order. 5. Of note patient also had outside CT chest ordered for today- I am assuming you will want to order this inpatient to be done prior to patient discharge 6.  Please await any final recommendations from Dr. Fuller Plan later today.  We will sign off.  Thank you for kind consultation.   LOS: 2 days   Levin Erp  09/07/2020, 9:44 AM     Attending Physician Note   I have taken an interval history, reviewed the chart and examined the patient. I agree with the Advanced Practitioner's note, impression and recommendations.   Verbal report: ascitic fluid is positive for carcinoma.  Written report is not yet available in Epic.   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 Further plans per oncology. Chest CT ordered by Dr. Alen Blew. EUS for next week has been cancelled. No additional GI evaluation recommended at this time.    * Abdominal pain, abdominal distention due metastatic carcinoma, large volume of malignant ascites, SBP. Continue ceftriaxone 2g IV qd in hospital and change to PO antibiotics to complete a 10-14 day course. Repeat paracentesis and Port-A-Cath requested by oncology.   GI signing off, available as needed. Outpatient GI follow up with Dr. Hilarie Fredrickson as needed.  Lucio Edward, MD [facility] Gastroenterology

## 2020-09-07 NOTE — Progress Notes (Signed)
PROGRESS NOTE    Jesse Frye  MWU:[id number] DOB: [birth year] DOA: 09/05/2020 PCP: System, Provider Not In    Chief Complaint  Patient presents with  . Abdominal Pain    Brief Narrative:   65 year old Hispanic gentleman has no significant past medical history recently developed abdominal pain was seen in the ED on December 21 while CT abdomen showed "a heterogeneous mass in the head of the pancreas with findings suspicious for extent of local tumor involvement and nodal disease as well as peritoneal disease" He is in the process of getting cancer work-up outpatient, however developed abdominal distention, intractable nausea and vomiting, He says that over the last 4 days, he's had N/V which has increased to the point that he is not able to eat, he presented to [facility] long ED, found to have hyponatremia, ascites, paracentesis ordered in the ED, GI consulted , oncology notified ,patient is admitted to [facility]  Subjective:  Reports abdomen is more distended today  some mild right lower ab pain, no n/v, wants to eat No fever, still no bm , reports feeling very weak Family at bedside   Assessment & Plan:   Active Problems:   Intractable nausea and vomiting   Ascites   Malignant neoplasm of pancreas (HCC)   Generalized abdominal pain   Ascites, likely malignant in nature, cytology pending -Status post paracentesis removed 6.1 L, fluid study total nucleated cells 2344, Gram stain no organisms seen, culture no growth so far -GI recommended Rocephin for now due to elevated WBC and ascites fluids - will follow-up on cytology result -Ascites appear reaccumulating, repeat therapeutic paracentesis today  Pancreatic mass , 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 with high-grade narrowing, 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,  Pancreatic mass is unresectable, not amiable to IR biopsy CA 19-9 elevated at 68 Ascites cytology  pending Oncology recommend CT chest with contrast  Port placement today  will follow oncology and GI recommendation  Nausea and vomiting None since in the hospital, as needed antiemetics Start diet after procedure today  Hyponatremia,  Likely multifactorial including dehydration, but does has ascites, albumin 2.7 Sodium 123 on presentation, improved to 127 on IV fluids Hold off IV fluids due to re accumulating ascites check urine sodium and osmole  Subacute fracture of RIGHT seventh rib, incidental finding on CT scan Denies chest pain, encourage incentive spirometer  Constipation: Start stool softener  FTT/deconditioning;  PT eval   DVT prophylaxis: SCDs Start: 09/05/20 1631   Code Status: Full Family Communication: Family at bedside, patient prefers to speak English  Disposition:   Status is: Inpatient  Dispo: The patient is from: Home              Anticipated d/c is to: Pending PT eval              Anticipated d/c date is: To be determined                 Consultants:   GI  IR  oncology  Procedures:   paracentesisx2  Port placement  Antimicrobials:   Rocephin from 1/ 6     Objective: Vitals:   09/06/20 1017 09/06/20 1406 09/06/20 2133 09/07/20 0557  BP: 125/74 139/76 116/73 117/80  Pulse: 79 81 83 90  Resp: 16 16 15 16   Temp: 98.5 F (36.9 C) 98 F (36.7 C) 98.8 F (37.1 C)   TempSrc: Oral Oral Oral   SpO2: 97%  99% 95% 97%  Weight:      Height:        Intake/Output Summary (Last 24 hours) at 09/07/2020 0916 Last data filed at 09/07/2020 0600 Gross per 24 hour  Intake 460 ml  Output 400 ml  Net 60 ml   Filed Weights   09/05/20 0950  Weight: 72.6 kg    Examination:  General exam: frail, weak, NAD Respiratory system: Clear to auscultation. Respiratory effort normal. Cardiovascular system: S1 & S2 heard, RRR. No pedal edema. Gastrointestinal system: Abdomen is distended, soft , slight right lower quandrant tenderness, no rebound.  Normal bowel sounds heard. Central nervous system: Alert and oriented. No focal neurological deficits. Extremities: generalized weakness Skin: No rashes, lesions or ulcers Psychiatry: Judgement and insight appear normal. Mood & affect appropriate.     Data Reviewed: I have personally reviewed following labs and imaging studies  CBC: Recent Labs  Lab 09/05/20 1131 09/06/20 0608  WBC 11.1* 10.4  NEUTROABS 9.3*  --   HGB 13.2 12.8*  HCT 38.5* 37.7*  MCV 90.8 93.1  PLT 400 607    Basic Metabolic Panel: Recent Labs  Lab 09/05/20 1131 09/05/20 1738 09/05/20 2304 09/06/20 0608 09/07/20 0535  NA 123* 127* 127* 127* 131*  K 4.6 4.7 4.3 4.1 4.5  CL 88* 92* 93* 95* 96*  CO2 24 25 23  21* 25  GLUCOSE 108* 82 70 69* 127*  BUN 23 20 18 19 20   CREATININE 0.88 0.77 0.78 0.73 0.85  CALCIUM 8.1* 7.8* 7.9* 7.7* 8.3*  MG  --   --   --   --  2.2    GFR: Estimated Creatinine Clearance: 84.2 mL/min (by C-G formula based on SCr of 0.85 mg/dL).  Liver Function Tests: Recent Labs  Lab 09/05/20 1131 09/05/20 1446 09/06/20 0608 09/07/20 0535  AST 26  --  20 21  ALT 22  --  18 18  ALKPHOS 55  --  45 52  BILITOT 1.1  --  1.3* 0.8  PROT 6.6 6.3* 5.9* 6.2*  ALBUMIN 3.0*  --  2.7* 2.8*    CBG: No results for input(s): GLUCAP in the last 168 hours.   Recent Results (from the past 240 hour(s))  Resp Panel by RT-PCR (Flu A&B, Covid) Nasopharyngeal Swab     Status: None   Collection Time: 09/05/20 11:31 AM   Specimen: Nasopharyngeal Swab; Nasopharyngeal(NP) swabs in vial transport medium  Result Value Ref Range Status   SARS Coronavirus 2 by RT PCR NEGATIVE NEGATIVE Final    Comment: (NOTE) SARS-CoV-2 target nucleic acids are NOT DETECTED.  The SARS-CoV-2 RNA is generally detectable in upper respiratory specimens during the acute phase of infection. The lowest concentration of SARS-CoV-2 viral copies this assay can detect is 138 copies/mL. A negative result does not preclude  SARS-Cov-2 infection and should not be used as the sole basis for treatment or other patient management decisions. A negative result may occur with  improper specimen collection/handling, submission of specimen other than nasopharyngeal swab, presence of viral mutation(s) within the areas targeted by this assay, and inadequate number of viral copies(<138 copies/mL). A negative result must be combined with clinical observations, patient history, and epidemiological information. The expected result is Negative.  Fact Sheet for Patients:  EntrepreneurPulse.com.au  Fact Sheet for Healthcare Providers:  IncredibleEmployment.be  This test is no t yet approved or cleared by the [place] FDA and  has been authorized for detection and/or diagnosis of SARS-CoV-2 by FDA under an Emergency  Use Authorization (EUA). This EUA will remain  in effect (meaning this test can be used) for the duration of the COVID-19 declaration under Section 564(b)(1) of the Act, 21 U.S.C.section 360bbb-3(b)(1), unless the authorization is terminated  or revoked sooner.       Influenza A by PCR NEGATIVE NEGATIVE Final   Influenza B by PCR NEGATIVE NEGATIVE Final    Comment: (NOTE) The Xpert Xpress SARS-CoV-2/FLU/RSV plus assay is intended as an aid in the diagnosis of influenza from Nasopharyngeal swab specimens and should not be used as a sole basis for treatment. Nasal washings and aspirates are unacceptable for Xpert Xpress SARS-CoV-2/FLU/RSV testing.  Fact Sheet for Patients: EntrepreneurPulse.com.au  Fact Sheet for Healthcare Providers: IncredibleEmployment.be  This test is not yet approved or cleared by the [place] FDA and has been authorized for detection and/or diagnosis of SARS-CoV-2 by FDA under an Emergency Use Authorization (EUA). This EUA will remain in effect (meaning this test can be used) for the duration of  the COVID-19 declaration under Section 564(b)(1) of the Act, 21 U.S.C. section 360bbb-3(b)(1), unless the authorization is terminated or revoked.  Performed at [facility], [place] [address] [place], [place] [address]   Body fluid culture     Status: None (Preliminary result)   Collection Time: 09/05/20  3:42 PM   Specimen: PATH Cytology Peritoneal fluid  Result Value Ref Range Status   Specimen Description   Final    PERITONEAL Performed at [facility] [address] [place], [place] [address]    Special Requests   Final    NONE Performed at [facility], [place] [address], [place], [place] [address]    Gram Stain   Final    ABUNDANT WBC PRESENT, PREDOMINANTLY MONONUCLEAR NO ORGANISMS SEEN    Culture   Final    NO GROWTH < 12 HOURS Performed at [facility] [name], [place], [place] [address]    Report Status PENDING  Incomplete  Surgical pcr screen     Status: None   Collection Time: 09/06/20  5:30 AM   Specimen: Nasal Mucosa; Nasal Swab  Result Value Ref Range Status   MRSA, PCR NEGATIVE NEGATIVE Final   Staphylococcus aureus NEGATIVE NEGATIVE Final    Comment: (NOTE) The Xpert SA Assay (FDA approved for NASAL specimens in patients 29 years of age and older), is one component of a comprehensive surveillance program. It is not intended to diagnose infection nor to guide or monitor treatment. Performed at [facility], [name] [address], [place], [place] [address]          Radiology Studies: US Paracentesis  Result Date: 09/05/2020 INDICATION: Patient with history of abdominal pain, nausea, vomiting, abdominal distension/ascites, pancreatic mass; request received for diagnostic and therapeutic paracentesis. EXAM: ULTRASOUND GUIDED DIAGNOSTIC AND THERAPEUTIC PARACENTESIS MEDICATIONS: 1% lidocaine to skin and subcutaneous tissue COMPLICATIONS: None immediate. PROCEDURE: Informed  written consent was obtained from the patient after a discussion of the risks, benefits and alternatives to treatment. A timeout was performed prior to the initiation of the procedure. Initial ultrasound scanning demonstrates a large amount of ascites within the right lower abdominal quadrant. The right lower abdomen was prepped and draped in the usual sterile fashion. 1% lidocaine was used for local anesthesia. Following this, a 19 gauge, 10-cm, Yueh catheter was introduced. An ultrasound image was saved for documentation purposes. The paracentesis was performed. The catheter was removed and a dressing was applied. The patient  tolerated the procedure well without immediate post procedural complication. FINDINGS: A total of approximately 6.1 liters of hazy, slightly blood tinged/amber fluid was removed. Samples were sent to the laboratory as requested by the clinical team. IMPRESSION: Successful ultrasound-guided diagnostic and therapeutic paracentesis yielding 6.1 liters of peritoneal fluid. Read by: Rowe Robert, PA-C Electronically Signed   By: Aletta Edouard M.D.   On: 09/05/2020 16:21   DG Abdomen Acute W/Chest  Result Date: 09/05/2020 CLINICAL DATA:  Nausea, vomiting, loss of appetite and abdominal distension developing over the past few days. EXAM: DG ABDOMEN ACUTE WITH 1 VIEW CHEST COMPARISON:  PA and lateral chest 04/23/2028. CT abdomen and pelvis 08/21/2020. FINDINGS: Single-view of the chest demonstrates subsegmental atelectasis in the lower lung zones. No pneumothorax or pleural fluid. Heart size is normal. Aortic atherosclerosis. Two views of the abdomen show no free intraperitoneal air. The bowel gas pattern is nonobstructive. No acute or focal bony abnormality. IMPRESSION: No acute abnormality chest or abdomen. Electronically Signed   By: Inge Rise M.D.   On: 09/05/2020 11:26     LOS: 2 days   Time spent: 60mins, case discussed with oncology and GI Greater than 50% of this time was  spent in counseling, explanation of diagnosis, planning of further management, and coordination of care.  I have personally reviewed and interpreted on  09/07/2020 daily labs,I reviewed all nursing notes, pharmacy notes, consultant notes,  vitals, pertinent old records  I have discussed plan of care as described above with RN , patient and family on 09/07/2020  Voice Recognition /Dragon dictation system was used to create this note, attempts have been made to correct errors. Please contact the author with questions and/or clarifications.   Florencia Reasons, MD PhD FACP [facility]  Available via Epic secure chat 7am-7pm for nonurgent issues Please page for urgent issues To page the attending provider between 7A-7P or the covering provider during after hours 7P-7A, please log into the web site [email or web address] and access using universal [place] password for that web site. If you do not have the password, please call the hospital operator.    09/07/2020, 9:16 AM

## 2020-09-07 NOTE — Sedation Documentation (Signed)
Port a cath placement procedure begun. 

## 2020-09-07 NOTE — Progress Notes (Signed)
IP PROGRESS NOTE  Subjective:   Jesse Frye continues to report abdominal distention and poor p.o. intake.  He does report nausea but no vomiting.  He denies any fevers or chills.  He did report the ascites is reaccumulating and had relief after his first paracentesis.   Objective:  Vital signs in last 24 hours: Temp:  [98 F (36.7 C)-98.8 F (37.1 C)] 98.8 F (37.1 C) (01/06 2133) Pulse Rate:  [79-90] 90 (01/07 0557) Resp:  [15-16] 16 (01/07 0557) BP: (116-139)/(73-80) 117/80 (01/07 0557) SpO2:  [95 %-99 %] 97 % (01/07 0557) Weight change:     Intake/Output from previous day: 01/06 0701 - 01/07 0700 In: 460 [P.O.:360; IV Piggyback:100] Out: 400 [Urine:400] General: Alert, awake appeared in mild distress.  Chronically ill-appearing overall Head: Normocephalic atraumatic. Mouth: mucous membranes moist, pharynx normal without lesions Eyes: No scleral icterus.  Pupils are equal and round reactive to light. Resp: clear to auscultation bilaterally without rhonchi or wheezes or dullness to percussion. Cardio: regular rate and rhythm, S1, S2 normal, no murmur, click, rub or gallop GI: Distended without any rebound or guarding. Musculoskeletal: No joint deformity or effusion. Neurological: No motor, sensory deficits.  Intact deep tendon reflexes. Skin: No rashes or lesions.    Lab Results: Recent Labs    09/05/20 1131 09/06/20 0608  WBC 11.1* 10.4  HGB 13.2 12.8*  HCT 38.5* 37.7*  PLT 400 390    BMET Recent Labs    09/06/20 0608 09/07/20 0535  NA 127* 131*  K 4.1 4.5  CL 95* 96*  CO2 21* 25  GLUCOSE 69* 127*  BUN 19 20  CREATININE 0.73 0.85  CALCIUM 7.7* 8.3*    CA 19-9: 68  Studies/Results: US Paracentesis  Result Date: 09/05/2020 INDICATION: Patient with history of abdominal pain, nausea, vomiting, abdominal distension/ascites, pancreatic mass; request received for diagnostic and therapeutic paracentesis. EXAM: ULTRASOUND GUIDED DIAGNOSTIC AND THERAPEUTIC  PARACENTESIS MEDICATIONS: 1% lidocaine to skin and subcutaneous tissue COMPLICATIONS: None immediate. PROCEDURE: Informed written consent was obtained from the patient after a discussion of the risks, benefits and alternatives to treatment. A timeout was performed prior to the initiation of the procedure. Initial ultrasound scanning demonstrates a large amount of ascites within the right lower abdominal quadrant. The right lower abdomen was prepped and draped in the usual sterile fashion. 1% lidocaine was used for local anesthesia. Following this, a 19 gauge, 10-cm, Yueh catheter was introduced. An ultrasound image was saved for documentation purposes. The paracentesis was performed. The catheter was removed and a dressing was applied. The patient tolerated the procedure well without immediate post procedural complication. FINDINGS: A total of approximately 6.1 liters of hazy, slightly blood tinged/amber fluid was removed. Samples were sent to the laboratory as requested by the clinical team. IMPRESSION: Successful ultrasound-guided diagnostic and therapeutic paracentesis yielding 6.1 liters of peritoneal fluid. Read by: Rowe Robert, PA-C Electronically Signed   By: Aletta Edouard M.D.   On: 09/05/2020 16:21   DG Abdomen Acute W/Chest  Result Date: 09/05/2020 CLINICAL DATA:  Nausea, vomiting, loss of appetite and abdominal distension developing over the past few days. EXAM: DG ABDOMEN ACUTE WITH 1 VIEW CHEST COMPARISON:  PA and lateral chest 04/23/2028. CT abdomen and pelvis 08/21/2020. FINDINGS: Single-view of the chest demonstrates subsegmental atelectasis in the lower lung zones. No pneumothorax or pleural fluid. Heart size is normal. Aortic atherosclerosis. Two views of the abdomen show no free intraperitoneal air. The bowel gas pattern is nonobstructive. No acute or focal bony  abnormality. IMPRESSION: No acute abnormality chest or abdomen. Electronically Signed   By: Inge Rise M.D.   On: 09/05/2020  11:26    Medications: I have reviewed the patient's current medications.  Assessment/Plan:  65 year old man with:  1.  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  Pathology remains pending at this time but highly suspicious for pancreatic malignancy with elevated CA 19-9.  The natural course of this disease was reviewed again today with the patient and I recommended proceeding with systemic chemotherapy once pathological confirmation is available.  Chemotherapy will be palliative at best and certainly not curative.  It will offer palliation of his pain and ascites accumulation.  His performance status is reasonable to proceed with that upon his discharge.    2.  Ascites: Fluid is accumulating and would recommend repeating paracentesis prior to discharge.   3.  IV access: He will require a Port-A-Cath insertion at some point which can be done while we are awaiting his recovery as well as pathological confirmation.  Unless conventional radiology to assess for that possibility while he is hospitalized.   4.  Disposition follow-up: We will arrange follow-up upon his discharge immediately for the start of chemotherapy.  35  minutes were dedicated to this visit.  50% of the time was face-to-face and the time was spent on reviewing laboratory data, imaging studies, discussing treatment options, and answering questions regarding future plan.    LOS: 2 days   Zola Button 09/07/2020, 7:28 AM

## 2020-09-07 NOTE — Procedures (Signed)
Interventional Radiology Procedure:   Indications:  Pancreatic cancer with ascites  Procedure: Port placement and paracentesis  Findings: 1) 2900 ml of red ascites removed. 2) Right jugular port.  Initially port tip was at superior cavoatrial junction but catheter would not consistently aspirate.  New catheter was placed in right atrium.  Port is accessed and working well.   Complications: None     EBL: Minimal, less than 10 ml  Plan: Port is ready to use.    Caven Perine R. Anselm Pancoast, MD  Pager: [phone]

## 2020-09-08 LAB — BASIC METABOLIC PANEL
Anion gap: 10 (ref 5–15)
BUN: 23 mg/dL (ref 8–23)
CO2: 23 mmol/L (ref 22–32)
Calcium: 8.1 mg/dL — ABNORMAL LOW (ref 8.9–10.3)
Chloride: 98 mmol/L (ref 98–111)
Creatinine, Ser: 0.93 mg/dL (ref 0.61–1.24)
GFR, Estimated: >60 mL/min (ref >60)
Glucose, Bld: 121 mg/dL — ABNORMAL HIGH (ref 70–99)
Potassium: 4.7 mmol/L (ref 3.5–5.1)
Sodium: 131 mmol/L — ABNORMAL LOW (ref 135–145)

## 2020-09-08 MED ORDER — ENSURE ENLIVE PO LIQD
237.0000 mL | Freq: Two times a day (BID) | ORAL | Status: DC
  Administered 2020-09-08 – 2020-09-09 (×3): 237 mL via ORAL

## 2020-09-08 MED ORDER — RESOURCE INSTANT PROTEIN PO PWD PACKET
1.0000 | Freq: Three times a day (TID) | ORAL | Status: DC
  Administered 2020-09-08 – 2020-09-09 (×4): 6 g via ORAL
  Filled 2020-09-08 (×6): qty 6

## 2020-09-08 MED ORDER — CHLORHEXIDINE GLUCONATE CLOTH 2 % EX PADS
6.0000 | MEDICATED_PAD | Freq: Every day | CUTANEOUS | Status: DC
  Administered 2020-09-08 – 2020-09-10 (×3): 6 via TOPICAL

## 2020-09-08 MED ORDER — OXYCODONE HCL 5 MG PO TABS: 5.0000 mg | ORAL_TABLET | ORAL | Status: DC | PRN

## 2020-09-08 NOTE — Progress Notes (Addendum)
PROGRESS NOTE    Jesse Frye  ZES:[id number] DOB: 05-11-1956 DOA: 09/05/2020 PCP: System, Provider Not In    Chief Complaint  Patient presents with  . Abdominal Pain    Brief Narrative:   65 year old Hispanic gentleman has no significant past medical history recently developed abdominal pain was seen in the ED on December 21 while CT abdomen showed "a heterogeneous mass in the head of the pancreas with findings suspicious for extent of local tumor involvement and nodal disease as well as peritoneal disease" He is in the process of getting cancer work-up outpatient, however developed abdominal distention, intractable nausea and vomiting, He says that over the last 4 days, he's had N/V which has increased to the point that he is not able to eat, he presented to [facility] long ED, found to have hyponatremia, ascites, paracentesis ordered in the ED, GI consulted , oncology notified ,patient is admitted to [facility]  Subjective:   some mild right lower ab pain, no n/v, wants to eat No fever, still no bm , reports feeling very weak Family at bedside   Assessment & Plan:   Active Problems:   Intractable nausea and vomiting   Ascites   Malignant neoplasm of pancreas (HCC)   Generalized abdominal pain   Ascites, likely malignant in nature, cytology pending -Status post paracentesis removed 6.1 L, fluid study total nucleated cells 2344, Gram stain no organisms seen, culture no growth so far -GI recommended Rocephin for now due to elevated WBC and ascites fluids - will follow-up on cytology result -Ascites appear reaccumulating, repeat therapeutic paracentesis with 2liter removed on 1/7  Pancreatic mass , 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 with high-grade narrowing, 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,  Pancreatic mass is unresectable, not amiable to IR biopsy CA 19-9 elevated at 68 Ascites cytology pending Oncology recommend CT  chest with contrast  Port placement today  will follow oncology and GI recommendation  Nausea and vomiting None since in the hospital, as needed antiemetics Start diet after procedure today  Hyponatremia,  Likely multifactorial including dehydration, but does has ascites, albumin 2.7 Sodium 123 on presentation, improved to 131 Hold off IV fluids due to re accumulating ascites Urine sodium less than 10 , likely from dehydration, encourage oral intake  Subacute fracture of RIGHT seventh rib, incidental finding on CT scan Denies chest pain, encourage incentive spirometer  Constipation: Start stool softener  FTT/deconditioning;  PT eval   DVT prophylaxis: SCDs Start: 09/05/20 1631   Code Status: Full Family Communication: Family at bedside, patient prefers to speak English  Disposition:   Status is: Inpatient  Dispo: The patient is from: Home              Anticipated d/c is to: Pending PT eval              Anticipated d/c date is: likely tomorrow                 Consultants:   GI  IR  oncology  Procedures:   paracentesisx2  Port placement  Antimicrobials:   Rocephin from 1/ 6     Objective: Vitals:   09/07/20 2126 09/08/20 0202 09/08/20 0631 09/08/20 0921  BP: 108/74 123/85 126/86 132/89  Pulse: 90 95 90 98  Resp: 18 18 18 15   Temp:  98.6 F (37 C) 97.9 F (36.6 C) 98.4 F (36.9 C)  TempSrc: Oral Oral Oral Oral  SpO2:  96% 94% 96% 98%  Weight:      Height:        Intake/Output Summary (Last 24 hours) at 09/08/2020 1206 Last data filed at 09/08/2020 0900 Gross per 24 hour  Intake 780 ml  Output 700 ml  Net 80 ml   Filed Weights   09/05/20 0950  Weight: 72.6 kg    Examination:  General exam: frail, weak, NAD Respiratory system: Clear to auscultation. Respiratory effort normal. Cardiovascular system: S1 & S2 heard, RRR. No pedal edema. Gastrointestinal system: Abdomen is less distended, soft , slight right lower quandrant tenderness, no  rebound. Normal bowel sounds heard. Central nervous system: Alert and oriented. No focal neurological deficits. Extremities: generalized weakness Skin: No rashes, lesions or ulcers Psychiatry: Judgement and insight appear normal. Mood & affect appropriate.     Data Reviewed: I have personally reviewed following labs and imaging studies  CBC: Recent Labs  Lab 09/05/20 1131 09/06/20 0608  WBC 11.1* 10.4  NEUTROABS 9.3*  --   HGB 13.2 12.8*  HCT 38.5* 37.7*  MCV 90.8 93.1  PLT 400 [id number]    Basic Metabolic Panel: Recent Labs  Lab 09/05/20 1738 09/05/20 2304 09/06/20 0608 09/07/20 0535 09/08/20 0543  NA 127* 127* 127* 131* 131*  K 4.7 4.3 4.1 4.5 4.7  CL 92* 93* 95* 96* 98  CO2 25 23 21* 25 23  GLUCOSE 82 70 69* 127* 121*  BUN 20 18 19 20 23   CREATININE 0.77 0.78 0.73 0.85 0.93  CALCIUM 7.8* 7.9* 7.7* 8.3* 8.1*  MG  --   --   --  2.2  --     GFR: Estimated Creatinine Clearance: 77 mL/min (by C-G formula based on SCr of 0.93 mg/dL).  Liver Function Tests: Recent Labs  Lab 09/05/20 1131 09/05/20 1446 09/06/20 0608 09/07/20 0535  AST 26  --  20 21  ALT 22  --  18 18  ALKPHOS 55  --  45 52  BILITOT 1.1  --  1.3* 0.8  PROT 6.6 6.3* 5.9* 6.2*  ALBUMIN 3.0*  --  2.7* 2.8*    CBG: No results for input(s): GLUCAP in the last 168 hours.   Recent Results (from the past 240 hour(s))  Resp Panel by RT-PCR (Flu A&B, Covid) Nasopharyngeal Swab     Status: None   Collection Time: 09/05/20 11:31 AM   Specimen: Nasopharyngeal Swab; Nasopharyngeal(NP) swabs in vial transport medium  Result Value Ref Range Status   SARS Coronavirus 2 by RT PCR NEGATIVE NEGATIVE Final    Comment: (NOTE) SARS-CoV-2 target nucleic acids are NOT DETECTED.  The SARS-CoV-2 RNA is generally detectable in upper respiratory specimens during the acute phase of infection. The lowest concentration of SARS-CoV-2 viral copies this assay can detect is 138 copies/mL. A negative result does not  preclude SARS-Cov-2 infection and should not be used as the sole basis for treatment or other patient management decisions. A negative result may occur with  improper specimen collection/handling, submission of specimen other than nasopharyngeal swab, presence of viral mutation(s) within the areas targeted by this assay, and inadequate number of viral copies(<138 copies/mL). A negative result must be combined with clinical observations, patient history, and epidemiological information. The expected result is Negative.  Fact Sheet for Patients:  EntrepreneurPulse.com.au  Fact Sheet for Healthcare Providers:  IncredibleEmployment.be  This test is no t yet approved or cleared by the [place] FDA and  has been authorized for detection and/or diagnosis of SARS-CoV-2 by FDA under  an Emergency Use Authorization (EUA). This EUA will remain  in effect (meaning this test can be used) for the duration of the COVID-19 declaration under Section 564(b)(1) of the Act, 21 U.S.C.section 360bbb-3(b)(1), unless the authorization is terminated  or revoked sooner.       Influenza A by PCR NEGATIVE NEGATIVE Final   Influenza B by PCR NEGATIVE NEGATIVE Final    Comment: (NOTE) The Xpert Xpress SARS-CoV-2/FLU/RSV plus assay is intended as an aid in the diagnosis of influenza from Nasopharyngeal swab specimens and should not be used as a sole basis for treatment. Nasal washings and aspirates are unacceptable for Xpert Xpress SARS-CoV-2/FLU/RSV testing.  Fact Sheet for Patients: EntrepreneurPulse.com.au  Fact Sheet for Healthcare Providers: IncredibleEmployment.be  This test is not yet approved or cleared by the [place] FDA and has been authorized for detection and/or diagnosis of SARS-CoV-2 by FDA under an Emergency Use Authorization (EUA). This EUA will remain in effect (meaning this test can be used) for the  duration of the COVID-19 declaration under Section 564(b)(1) of the Act, 21 U.S.C. section 360bbb-3(b)(1), unless the authorization is terminated or revoked.  Performed at [facility], [place] [address], [place], [place] [address]   Body fluid culture     Status: None (Preliminary result)   Collection Time: 09/05/20  3:42 PM   Specimen: PATH Cytology Peritoneal fluid  Result Value Ref Range Status   Specimen Description   Final    PERITONEAL Performed at [facility] [address], [place], [place] [address]    Special Requests   Final    NONE Performed at [facility], [place] [address] [place], [place] [address]    Gram Stain   Final    ABUNDANT WBC PRESENT, PREDOMINANTLY MONONUCLEAR NO ORGANISMS SEEN    Culture   Final    NO GROWTH 3 DAYS Performed at [facility] [address], [place], [place] [address]    Report Status PENDING  Incomplete  Surgical pcr screen     Status: None   Collection Time: 09/06/20  5:30 AM   Specimen: Nasal Mucosa; Nasal Swab  Result Value Ref Range Status   MRSA, PCR NEGATIVE NEGATIVE Final   Staphylococcus aureus NEGATIVE NEGATIVE Final    Comment: (NOTE) The Xpert SA Assay (FDA approved for NASAL specimens in patients 26 years of age and older), is one component of a comprehensive surveillance program. It is not intended to diagnose infection nor to guide or monitor treatment. Performed at [facility], [place] [address] [place], [place] [address]          Radiology Studies: CT CHEST W CONTRAST  Result Date: 09/07/2020 CLINICAL DATA:  Pancreatic carcinoma. EXAM: CT CHEST WITH CONTRAST TECHNIQUE: Multidetector CT imaging of the chest was performed during intravenous contrast administration. CONTRAST:  50mL OMNIPAQUE IOHEXOL 300 MG/ML  SOLN COMPARISON:  Abdomen CT on 08/21/2020; no prior chest CT FINDINGS: Cardiovascular: No acute findings. Aortic  atherosclerotic calcification noted. Mediastinum/Nodes: No masses or pathologically enlarged lymph nodes identified. Lungs/Pleura: Linear opacity in both lower lobes is consistent with subsegmental atelectasis. No evidence of pulmonary infiltrate or pleural effusion. A 3 mm pulmonary nodule is seen in the medial right upper lobe on image 33/5, and a 3 mm nodule is seen in the left lung apex on image 18/5, which are nonspecific but relatively unlikely to represent metastatic disease. Upper Abdomen: Large pancreatic soft tissue mass in the central upper abdomen has  increased since previous study. There is new perihepatic ascites and diffuse omental soft tissue stranding, consistent with worsening peritoneal carcinomatosis. Musculoskeletal:  No suspicious bone lesions. IMPRESSION: No definite metastatic disease within the thorax. 2 tiny bilateral upper lobe pulmonary nodules are nonspecific, but unlikely represent metastatic disease. Recommend continued attention on follow-up imaging. Bilateral lower lobe subsegmental atelectasis. Increased size of large pancreatic soft tissue mass and worsening peritoneal carcinomatosis and perihepatic ascites. Aortic Atherosclerosis (ICD10-I70.0). Electronically Signed   By: Marlaine Hind M.D.   On: 09/07/2020 19:40   IR IMAGING GUIDED PORT INSERTION  Result Date: 09/07/2020 INDICATION: 65 year old with a pancreatic mass and peritoneal disease. Port-A-Cath needed for chemotherapy. EXAM: FLUOROSCOPIC AND ULTRASOUND GUIDED PLACEMENT OF A SUBCUTANEOUS PORT COMPARISON:  None. MEDICATIONS: Patient is receiving antibiotics as inpatient and additional antibiotics were not given. ANESTHESIA/SEDATION: Versed 3 mg IV; Fentanyl 100 mcg IV; Moderate Sedation Time:  1 hour and 29 minutes The patient was continuously monitored during the procedure by the interventional radiology nurse under my direct supervision. FLUOROSCOPY TIME:  1 minutes, 30 seconds, 8 mGy CONTRAST:  10 mL Omnipaque [id number]  COMPLICATIONS: None immediate. PROCEDURE: The procedure, risks, benefits, and alternatives were explained to the patient. Questions regarding the procedure were encouraged and answered. The patient understands and consents to the procedure. Ultrasound-guided paracentesis was performed immediately prior to Port-A-Cath placement. Peritoneal fluid was draining throughout the port placement. Patient was placed supine on the interventional table. Ultrasound confirmed a patent right internal jugular vein. Ultrasound image was saved for documentation. The right chest and neck were cleaned with a skin antiseptic and a sterile drape was placed. Maximal barrier sterile technique was utilized including caps, mask, sterile gowns, sterile gloves, sterile drape, hand hygiene and skin antiseptic. The right neck was anesthetized with 1% lidocaine. Small incision was made in the right neck with a blade. Micropuncture set was placed in the right internal jugular vein with ultrasound guidance. The micropuncture wire was used for measurement purposes. The right chest was anesthetized with 1% lidocaine with epinephrine. #15 blade was used to make an incision and a subcutaneous port pocket was formed. [employer] was assembled. Subcutaneous tunnel was formed with a stiff tunneling device. The port catheter was brought through the subcutaneous tunnel. The port was placed in the subcutaneous pocket. The micropuncture set was exchanged for a peel-away sheath. The catheter was placed through the peel-away sheath and the tip was positioned at the superior cavoatrial junction. Catheter placement was confirmed with fluoroscopy. The port was accessed and flushed with saline. The port pocket was closed using two layers of absorbable sutures and Dermabond. The vein skin site was closed using a single layer of absorbable suture and Dermabond. The port was accessed again. Unfortunately, the port would no longer aspirate. A new access needle  was tried but again the port was not aspirating. Contrast injection confirmed that the catheter was well positioned at the superior cavoatrial junction. Fluoroscopic evaluation suggested that the port was intermittently flipping up against the vein wall. Port function was not acceptable, therefore, the Dermabond was removed over the port insertion. The incision was opened up. The port was pulled out of the pocket. Even after some repositioning, the port was consistently flipping up against the vein wall. Therefore, the port was taken apart and the catheter was completely removed over a stiff Glidewire. A new port catheter was advanced over the wire and positioned in the right atrium. The catheter was aspirating well within the right atrium.  Therefore, the port was reassembled. Port was placed back in the pocket. Pocket was closed using 2 layers of absorbable suture and Dermabond. Port was accessed again and found to aspirate and flush well. The port was flushed with normal saline. FINDINGS: Initial placement of the Port-A-Cath was at the superior cavoatrial junction. The catheter was not aspirating well at the superior cavoatrial junction. As a result, a new port was placed and positioned in the right atrium. The port aspirates and flushes well in the right atrium. IMPRESSION: Placement of a subcutaneous port device. Electronically Signed   By: Markus Daft M.D.   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 Patient complains of abdominal pain and distension. EXAM: ULTRASOUND GUIDED  PARACENTESIS MEDICATIONS: None. COMPLICATIONS: None immediate. PROCEDURE: Informed written consent was obtained from the patient after a discussion of the risks, benefits and alternatives to treatment. A timeout was performed prior to the initiation of the procedure. Initial ultrasound scanning demonstrates a large amount of ascites  within the right lower abdominal quadrant. The right lower abdomen was prepped and draped in the usual sterile fashion. 1% lidocaine was used for local anesthesia. Following this, a 6 Fr Safe-T-Centesis catheter was introduced. An ultrasound image was saved for documentation purposes. The paracentesis was performed. The catheter was removed and a dressing was applied. The patient tolerated the procedure well without immediate post procedural complication. Patient received post-procedure intravenous albumin; see nursing notes for details. FINDINGS: A total of approximately 2.9 L of red fluid was removed. IMPRESSION: Successful ultrasound-guided paracentesis yielding 2.9 liters of peritoneal fluid. Electronically Signed   By: Markus Daft M.D.   On: 09/07/2020 18:06     LOS: 3 days   Time spent: 28mins, Greater than 50% of this time was spent in counseling, explanation of diagnosis, planning of further management, and coordination of care.  I have personally reviewed and interpreted on  09/08/2020 daily labs,I reviewed all nursing notes, pharmacy notes, consultant notes,  vitals, pertinent old records  I have discussed plan of care as described above with RN , patient and family on 09/08/2020  Voice Recognition /Dragon dictation system was used to create this note, attempts have been made to correct errors. Please contact the author with questions and/or clarifications.   Florencia Reasons, MD PhD FACP [facility]  Available via Epic secure chat 7am-7pm for nonurgent issues Please page for urgent issues To page the attending provider between 7A-7P or the covering provider during after hours 7P-7A, please log into the web site [email or web address] and access using universal [place] password for that web site. If you do not have the password, please call the hospital operator.    09/08/2020, 12:06 PM

## 2020-09-09 ENCOUNTER — Inpatient Hospital Stay (HOSPITAL_COMMUNITY): Payer: Self-pay

## 2020-09-09 LAB — LACTATE DEHYDROGENASE, PLEURAL OR PERITONEAL FLUID: LD, Fluid: 263 U/L — ABNORMAL HIGH (ref 3–23)

## 2020-09-09 LAB — GRAM STAIN

## 2020-09-09 LAB — BASIC METABOLIC PANEL
Anion gap: 11 (ref 5–15)
BUN: 22 mg/dL (ref 8–23)
CO2: 24 mmol/L (ref 22–32)
Calcium: 8.1 mg/dL — ABNORMAL LOW (ref 8.9–10.3)
Chloride: 95 mmol/L — ABNORMAL LOW (ref 98–111)
Creatinine, Ser: 0.94 mg/dL (ref 0.61–1.24)
GFR, Estimated: >60 mL/min (ref >60)
Glucose, Bld: 100 mg/dL — ABNORMAL HIGH (ref 70–99)
Potassium: 4.7 mmol/L (ref 3.5–5.1)
Sodium: 130 mmol/L — ABNORMAL LOW (ref 135–145)

## 2020-09-09 LAB — ALBUMIN, PLEURAL OR PERITONEAL FLUID: Albumin, Fluid: 1.2 g/dL

## 2020-09-09 LAB — CBC
HCT: 38.3 % — ABNORMAL LOW (ref 39.0–52.0)
Hemoglobin: 12.9 g/dL — ABNORMAL LOW (ref 13.0–17.0)
MCH: 31 pg (ref 26.0–34.0)
MCHC: 33.7 g/dL (ref 30.0–36.0)
MCV: 92.1 fL (ref 80.0–100.0)
Platelets: 374 10*3/uL (ref 150–400)
RBC: 4.16 MIL/uL — ABNORMAL LOW (ref 4.22–5.81)
RDW: 12.6 % (ref 11.5–15.5)
WBC: 9.2 10*3/uL (ref 4.0–10.5)
nRBC: 0 % (ref 0.0–0.2)

## 2020-09-09 LAB — BODY FLUID CULTURE: Culture: NO GROWTH

## 2020-09-09 LAB — BODY FLUID CELL COUNT WITH DIFFERENTIAL
Eos, Fluid: 1 %
Lymphs, Fluid: 40 %
Monocyte-Macrophage-Serous Fluid: 7 % — ABNORMAL LOW (ref 50–90)
Neutrophil Count, Fluid: 52 % — ABNORMAL HIGH (ref 0–25)
Total Nucleated Cell Count, Fluid: 2742 cu mm — ABNORMAL HIGH (ref 0–1000)

## 2020-09-09 LAB — PROTEIN, PLEURAL OR PERITONEAL FLUID: Total protein, fluid: <3 g/dL

## 2020-09-09 MED ORDER — LIDOCAINE HCL 1 % IJ SOLN
INTRAMUSCULAR | Status: AC
  Filled 2020-09-09: qty 20

## 2020-09-09 MED ORDER — ALBUMIN HUMAN 5 % IV SOLN
12.5000 g | Freq: Once | INTRAVENOUS | Status: AC
  Administered 2020-09-09: 12.5 g via INTRAVENOUS
  Filled 2020-09-09: qty 250

## 2020-09-09 MED ORDER — CEFDINIR 300 MG PO CAPS
300.0000 mg | ORAL_CAPSULE | Freq: Two times a day (BID) | ORAL | Status: DC
  Administered 2020-09-09 – 2020-09-10 (×3): 300 mg via ORAL
  Filled 2020-09-09 (×3): qty 1

## 2020-09-09 NOTE — Progress Notes (Addendum)
PROGRESS NOTE    Jesse Frye  [id number] DOB: 06/14/1956 DOA: 09/05/2020 PCP: System, Provider Not In    Chief Complaint  Patient presents with  . Abdominal Pain    Brief Narrative:   65 year old Hispanic gentleman has no significant past medical history recently developed abdominal pain was seen in the ED on December 21 while CT abdomen showed "a heterogeneous mass in the head of the pancreas with findings suspicious for extent of local tumor involvement and nodal disease as well as peritoneal disease" He is in the process of getting cancer work-up outpatient, however developed abdominal distention, intractable nausea and vomiting, He says that over the last 4 days, he's had N/V which has increased to the point that he is not able to eat, he presented to [facility] long ED, found to have hyponatremia, ascites, paracentesis ordered in the ED, GI consulted , oncology notified ,patient is admitted to [facility]  Subjective:  Report abdominal fluid has gone back up, abdomen is tight some mild right lower ab pain, no n/v, no fever He wants to eat Wife suppose to be here this morning , has not arrived yet  Assessment & Plan:   Active Problems:   Intractable nausea and vomiting   Ascites   Malignant neoplasm of pancreas (HCC)   Generalized abdominal pain   Ascites - GI state cytology reviewed malignant cells, I could not locate the origin pathology report -Status post paracentesis removed 6.1 L, fluid study total nucleated cells 2344, Gram stain no organisms seen, culture no growth so far -GI recommended abx treatment for SBP for at least 10 days due to elevated WBC in ascites fluids, he was initially treated with Rocephin, change to oral meds to minimize volume, as his ascites is reaccumulating pretty fast - Ascites appear reaccumulating, repeat therapeutic paracentesis with 2liters removed on 1/7 , will repeat paracentesis today on 1/9 with albumin x1 , another 2liter removed  6 PM  addendum: Wife arrived to the hospital, I went back to check on patient and update the wife. Patient stated that  ascites reaccumulated after paracentesis this morning I discussed with him about the possibility of needing drain catheter placement prior to discharge, we will keep him n.p.o. after midnight, reassess ascites fluids in the morning to decide whether he will need drain placement.  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 Alen Blew  Nausea and vomiting None since in the hospital, as needed antiemetics Advance diet as tolerated, start nutrition supplement  Hyponatremia,  Likely multifactorial including dehydration, but does has ascites, albumin 2.7 Urine sodium less than 10  avoid IV fluids due to re accumulating ascites Sodium 123 on presentation, improved to 131  encourage oral intake  Subacute fracture of RIGHT seventh rib, incidental finding on CT scan Denies chest pain, encourage incentive spirometer  Constipation: No BM for a week ,start stool softener, first BM on January 9  FTT/deconditioning;  PT eval   DVT prophylaxis: SCDs Start: 09/05/20 1631   Code Status: Full Family Communication:  patient prefers  to speak English  Disposition:   Status is: Inpatient  Dispo: The patient is from: Home              Anticipated d/c is to: Home              Anticipated d/c date is: likely tomorrow if ascites does not reaccumulate fast, need oncology clearance                 Consultants:    GI  IR  oncology  Procedures:   paracentesisx3  Port placement  Antimicrobials:   Rocephin from 1/ 6 to January to 9, then omnicef from 1/9    Objective: Vitals:   09/08/20 0921 09/08/20 1404 09/08/20 2133 09/09/20 0527  BP: 132/89 112/80 130/81 107/68  Pulse: 98 93 94 89  Resp: 15 14 18 18   Temp: 98.4 F (36.9 C) 98.3 F (36.8 C) 97.8 F (36.6 C) 97.7 F (36.5 C)  TempSrc: Oral Oral Oral Oral  SpO2: 98% 99% 100% 96%  Weight:      Height:        Intake/Output Summary (Last 24 hours) at 09/09/2020 0843 Last data filed at 09/08/2020 2100 Gross per 24 hour  Intake 60 ml  Output 250 ml  Net -190 ml   Filed Weights   09/05/20 0950  Weight: 72.6 kg    Examination:  General exam: frail, weak, NAD Respiratory system: Clear to auscultation. Respiratory effort normal. Cardiovascular system: S1 & S2 heard, RRR. No pedal edema. Gastrointestinal system: Abdomen is more distended, soft , slight right lower quandrant tenderness, no rebound. Normal bowel sounds heard. Central nervous system: Alert and oriented. No focal neurological deficits. Extremities: generalized weakness Skin: No rashes, lesions or ulcers Psychiatry: Judgement and insight appear normal. Mood & affect appropriate.     Data Reviewed: I have personally reviewed following labs and imaging studies  CBC: Recent Labs  Lab 09/05/20 1131 09/06/20 0608 09/09/20 0522  WBC 11.1* 10.4 9.2  NEUTROABS 9.3*  --   --   HGB 13.2 12.8* 12.9*  HCT 38.5* 37.7* 38.3*  MCV 90.8 93.1 92.1  PLT 400 390 [id number]    Basic Metabolic Panel: Recent Labs  Lab 09/05/20 2304 09/06/20 0608 09/07/20 0535 09/08/20 0543 09/09/20 0522  NA 127* 127* 131* 131* 130*  K 4.3 4.1 4.5 4.7 4.7  CL 93* 95* 96* 98 95*  CO2 23 21* 25 23 24   GLUCOSE 70 69* 127* 121* 100*  BUN 18 19 20 23 22   CREATININE 0.78 0.73 0.85 0.93 0.94  CALCIUM 7.9* 7.7* 8.3* 8.1* 8.1*  MG  --   --  2.2  --   --     GFR: Estimated Creatinine  Clearance: 76.2 mL/min (by C-G formula based on SCr of 0.94 mg/dL).  Liver Function Tests: Recent Labs  Lab 09/05/20 1131 09/05/20 1446 09/06/20 0608 09/07/20 0535  AST 26  --  20 21  ALT 22  --  18 18  ALKPHOS 55  --  45 52  BILITOT 1.1  --  1.3* 0.8  PROT 6.6 6.3* 5.9* 6.2*  ALBUMIN 3.0*  --  2.7* 2.8*    CBG: No results for input(s): GLUCAP in the last 168 hours.   Recent Results (from the past 240 hour(s))  Resp Panel by RT-PCR (Flu A&B, Covid) Nasopharyngeal Swab     Status: None   Collection Time: 09/05/20 11:31 AM   Specimen: Nasopharyngeal Swab; Nasopharyngeal(NP) swabs in vial transport medium  Result  Value Ref Range Status   SARS Coronavirus 2 by RT PCR NEGATIVE NEGATIVE Final    Comment: (NOTE) SARS-CoV-2 target nucleic acids are NOT DETECTED.  The SARS-CoV-2 RNA is generally detectable in upper respiratory specimens during the acute phase of infection. The lowest concentration of SARS-CoV-2 viral copies this assay can detect is 138 copies/mL. A negative result does not preclude SARS-Cov-2 infection and should not be used as the sole basis for treatment or other patient management decisions. A negative result may occur with  improper specimen collection/handling, submission of specimen other than nasopharyngeal swab, presence of viral mutation(s) within the areas targeted by this assay, and inadequate number of viral copies(<138 copies/mL). A negative result must be combined with clinical observations, patient history, and epidemiological information. The expected result is Negative.  Fact Sheet for Patients:  EntrepreneurPulse.com.au  Fact Sheet for Healthcare Providers:  IncredibleEmployment.be  This test is no t yet approved or cleared by the [place] FDA and  has been authorized for detection and/or diagnosis of SARS-CoV-2 by FDA under an Emergency Use Authorization (EUA). This EUA will remain  in effect  (meaning this test can be used) for the duration of the COVID-19 declaration under Section 564(b)(1) of the Act, 21 U.S.C.section 360bbb-3(b)(1), unless the authorization is terminated  or revoked sooner.       Influenza A by PCR NEGATIVE NEGATIVE Final   Influenza B by PCR NEGATIVE NEGATIVE Final    Comment: (NOTE) The Xpert Xpress SARS-CoV-2/FLU/RSV plus assay is intended as an aid in the diagnosis of influenza from Nasopharyngeal swab specimens and should not be used as a sole basis for treatment. Nasal washings and aspirates are unacceptable for Xpert Xpress SARS-CoV-2/FLU/RSV testing.  Fact Sheet for Patients: EntrepreneurPulse.com.au  Fact Sheet for Healthcare Providers: IncredibleEmployment.be  This test is not yet approved or cleared by the [place] FDA and has been authorized for detection and/or diagnosis of SARS-CoV-2 by FDA under an Emergency Use Authorization (EUA). This EUA will remain in effect (meaning this test can be used) for the duration of the COVID-19 declaration under Section 564(b)(1) of the Act, 21 U.S.C. section 360bbb-3(b)(1), unless the authorization is terminated or revoked.  Performed at [facility], [place] [address], [place], [place] [address]   Body fluid culture     Status: None (Preliminary result)   Collection Time: 09/05/20  3:42 PM   Specimen: PATH Cytology Peritoneal fluid  Result Value Ref Range Status   Specimen Description   Final    PERITONEAL Performed at [facility] [address] [place], [place] [address]    Special Requests   Final    NONE Performed at [facility], [place] [address], [place], [place] [address]    Gram Stain   Final    ABUNDANT WBC PRESENT, PREDOMINANTLY MONONUCLEAR NO ORGANISMS SEEN    Culture   Final    NO GROWTH 3 DAYS Performed at [facility] [address], [place], [place] [address]    Report  Status PENDING  Incomplete  Surgical pcr screen     Status: None   Collection Time: 09/06/20  5:30 AM   Specimen: Nasal Mucosa; Nasal Swab  Result Value Ref Range Status   MRSA, PCR NEGATIVE NEGATIVE Final   Staphylococcus aureus NEGATIVE NEGATIVE Final    Comment: (NOTE) The Xpert SA Assay (FDA approved for NASAL specimens in patients 55 years of age and older), is one component of a comprehensive surveillance  program. It is not intended to diagnose infection nor to guide or monitor treatment. Performed at [facility], [place] [address] [place], [place] [address]          Radiology Studies: CT CHEST W CONTRAST  Result Date: 09/07/2020 CLINICAL DATA:  Pancreatic carcinoma. EXAM: CT CHEST WITH CONTRAST TECHNIQUE: Multidetector CT imaging of the chest was performed during intravenous contrast administration. CONTRAST:  70mL OMNIPAQUE IOHEXOL 300 MG/ML  SOLN COMPARISON:  Abdomen CT on 08/21/2020; no prior chest CT FINDINGS: Cardiovascular: No acute findings. Aortic atherosclerotic calcification noted. Mediastinum/Nodes: No masses or pathologically enlarged lymph nodes identified. Lungs/Pleura: Linear opacity in both lower lobes is consistent with subsegmental atelectasis. No evidence of pulmonary infiltrate or pleural effusion. A 3 mm pulmonary nodule is seen in the medial right upper lobe on image 33/5, and a 3 mm nodule is seen in the left lung apex on image 18/5, which are nonspecific but relatively unlikely to represent metastatic disease. Upper Abdomen: Large pancreatic soft tissue mass in the central upper abdomen has increased since previous study. There is new perihepatic ascites and diffuse omental soft tissue stranding, consistent with worsening peritoneal carcinomatosis. Musculoskeletal:  No suspicious bone lesions. IMPRESSION: No definite metastatic disease within the thorax. 2 tiny bilateral upper lobe pulmonary nodules are nonspecific, but unlikely represent  metastatic disease. Recommend continued attention on follow-up imaging. Bilateral lower lobe subsegmental atelectasis. Increased size of large pancreatic soft tissue mass and worsening peritoneal carcinomatosis and perihepatic ascites. Aortic Atherosclerosis (ICD10-I70.0). Electronically Signed   By: Marlaine Hind M.D.   On: 09/07/2020 19:40   IR IMAGING GUIDED PORT INSERTION  Result Date: 09/07/2020 INDICATION: 65 year old with a pancreatic mass and peritoneal disease. Port-A-Cath needed for chemotherapy. EXAM: FLUOROSCOPIC AND ULTRASOUND GUIDED PLACEMENT OF A SUBCUTANEOUS PORT COMPARISON:  None. MEDICATIONS: Patient is receiving antibiotics as inpatient and additional antibiotics were not given. ANESTHESIA/SEDATION: Versed 3 mg IV; Fentanyl 100 mcg IV; Moderate Sedation Time:  1 hour and 29 minutes The patient was continuously monitored during the procedure by the interventional radiology nurse under my direct supervision. FLUOROSCOPY TIME:  1 minutes, 30 seconds, 8 mGy CONTRAST:  10 mL Omnipaque [id number] COMPLICATIONS: None immediate. PROCEDURE: The procedure, risks, benefits, and alternatives were explained to the patient. Questions regarding the procedure were encouraged and answered. The patient understands and consents to the procedure. Ultrasound-guided paracentesis was performed immediately prior to Port-A-Cath placement. Peritoneal fluid was draining throughout the port placement. Patient was placed supine on the interventional table. Ultrasound confirmed a patent right internal jugular vein. Ultrasound image was saved for documentation. The right chest and neck were cleaned with a skin antiseptic and a sterile drape was placed. Maximal barrier sterile technique was utilized including caps, mask, sterile gowns, sterile gloves, sterile drape, hand hygiene and skin antiseptic. The right neck was anesthetized with 1% lidocaine. Small incision was made in the right neck with a blade. Micropuncture set was placed  in the right internal jugular vein with ultrasound guidance. The micropuncture wire was used for measurement purposes. The right chest was anesthetized with 1% lidocaine with epinephrine. #15 blade was used to make an incision and a subcutaneous port pocket was formed. [place] was assembled. Subcutaneous tunnel was formed with a stiff tunneling device. The port catheter was brought through the subcutaneous tunnel. The port was placed in the subcutaneous pocket. The micropuncture set was exchanged for a peel-away sheath. The catheter was placed through the peel-away sheath and the tip was positioned at  the superior cavoatrial junction. Catheter placement was confirmed with fluoroscopy. The port was accessed and flushed with saline. The port pocket was closed using two layers of absorbable sutures and Dermabond. The vein skin site was closed using a single layer of absorbable suture and Dermabond. The port was accessed again. Unfortunately, the port would no longer aspirate. A new access needle was tried but again the port was not aspirating. Contrast injection confirmed that the catheter was well positioned at the superior cavoatrial junction. Fluoroscopic evaluation suggested that the port was intermittently flipping up against the vein wall. Port function was not acceptable, therefore, the Dermabond was removed over the port insertion. The incision was opened up. The port was pulled out of the pocket. Even after some repositioning, the port was consistently flipping up against the vein wall. Therefore, the port was taken apart and the catheter was completely removed over a stiff Glidewire. A new port catheter was advanced over the wire and positioned in the right atrium. The catheter was aspirating well within the right atrium. Therefore, the port was reassembled. Port was placed back in the pocket. Pocket was closed using 2 layers of absorbable suture and Dermabond. Port was accessed again and found to  aspirate and flush well. The port was flushed with normal saline. FINDINGS: Initial placement of the Port-A-Cath was at the superior cavoatrial junction. The catheter was not aspirating well at the superior cavoatrial junction. As a result, a new port was placed and positioned in the right atrium. The port aspirates and flushes well in the right atrium. IMPRESSION: Placement of a subcutaneous port device. Electronically Signed   By: Markus Daft M.D.   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 Patient complains of abdominal pain and distension. EXAM: ULTRASOUND GUIDED  PARACENTESIS MEDICATIONS: None. COMPLICATIONS: None immediate. PROCEDURE: Informed written consent was obtained from the patient after a discussion of the risks, benefits and alternatives to treatment. A timeout was performed prior to the initiation of the procedure. Initial ultrasound scanning demonstrates a large amount of ascites within the right lower abdominal quadrant. The right lower abdomen was prepped and draped in the usual sterile fashion. 1% lidocaine was used for local anesthesia. Following this, a 6 Fr Safe-T-Centesis catheter was introduced. An ultrasound image was saved for documentation purposes. The paracentesis was performed. The catheter was removed and a dressing was applied. The patient tolerated the procedure well without immediate post procedural complication. Patient received post-procedure intravenous albumin; see nursing notes for details. FINDINGS: A total of approximately 2.9 L of red fluid was removed. IMPRESSION: Successful ultrasound-guided paracentesis yielding 2.9 liters of peritoneal fluid. Electronically Signed   By: Markus Daft M.D.   On: 09/07/2020 18:06     LOS: 4 days   Time spent: 88mins, Greater than 50% of this time was spent in counseling, explanation of diagnosis, planning of further management, and  coordination of care.  I have personally reviewed and interpreted on  09/09/2020 daily labs,I reviewed all nursing notes, pharmacy notes, consultant notes,  vitals, pertinent old records  I have discussed plan of care as described above with RN , patient and family on 09/09/2020  Voice Recognition /Dragon dictation system was used to create this note, attempts have been made to correct errors. Please contact the author with questions and/or clarifications.   Florencia Reasons, MD PhD FACP [facility]  Available via Epic secure chat 7am-7pm for nonurgent  issues Please page for urgent issues To page the attending provider between 7A-7P or the covering provider during after hours 7P-7A, please log into the web site [email or web address] and access using universal [place] password for that web site. If you do not have the password, please call the hospital operator.    09/09/2020, 8:43 AM

## 2020-09-09 NOTE — Procedures (Signed)
PROCEDURE SUMMARY:  Successful US guided paracentesis from right lateral abdomen.  Yielded 2.0 liters of bloody fluid.  No immediate complications.  Pt tolerated well.   Specimen was sent for labs.  EBL < 68mL  Docia Barrier PA-C 09/09/2020 9:43 AM

## 2020-09-09 NOTE — Progress Notes (Signed)
PT Note  Patient Details Name: Jesse Frye MRN: [id number] DOB: 12-14-1955   Cancelled Treatment:     PT order received but eval deferred.  RN advises pt has been ambulating IND in halls since yesterday.  Will dc from PT services at this time.   Rhylei Mcquaig 09/09/2020, 8:17 AM

## 2020-09-10 ENCOUNTER — Other Ambulatory Visit (HOSPITAL_COMMUNITY): Payer: Self-pay

## 2020-09-10 ENCOUNTER — Other Ambulatory Visit: Payer: Self-pay

## 2020-09-10 ENCOUNTER — Other Ambulatory Visit: Payer: Self-pay | Admitting: Oncology

## 2020-09-10 DIAGNOSIS — K8689 Other specified diseases of pancreas: Secondary | ICD-10-CM

## 2020-09-10 DIAGNOSIS — Z7189 Other specified counseling: Secondary | ICD-10-CM | POA: Insufficient documentation

## 2020-09-10 LAB — BASIC METABOLIC PANEL
Anion gap: 11 (ref 5–15)
BUN: 23 mg/dL (ref 8–23)
CO2: 21 mmol/L — ABNORMAL LOW (ref 22–32)
Calcium: 8.2 mg/dL — ABNORMAL LOW (ref 8.9–10.3)
Chloride: 95 mmol/L — ABNORMAL LOW (ref 98–111)
Creatinine, Ser: 0.74 mg/dL (ref 0.61–1.24)
GFR, Estimated: >60 mL/min (ref >60)
Glucose, Bld: 126 mg/dL — ABNORMAL HIGH (ref 70–99)
Potassium: 4.6 mmol/L (ref 3.5–5.1)
Sodium: 127 mmol/L — ABNORMAL LOW (ref 135–145)

## 2020-09-10 LAB — MAGNESIUM: Magnesium: 2 mg/dL (ref 1.7–2.4)

## 2020-09-10 LAB — PH, BODY FLUID: pH, Body Fluid: 7.4

## 2020-09-10 MED ORDER — OXYCODONE HCL 5 MG PO TABS: 5.0000 mg | ORAL_TABLET | ORAL | 0 refills | Status: DC | PRN

## 2020-09-10 MED ORDER — ONDANSETRON HCL 4 MG PO TABS: 4.0000 mg | ORAL_TABLET | Freq: Four times a day (QID) | ORAL | 0 refills | Status: DC | PRN

## 2020-09-10 MED ORDER — SENNOSIDES-DOCUSATE SODIUM 8.6-50 MG PO TABS: 1.0000 | ORAL_TABLET | Freq: Two times a day (BID) | ORAL | 0 refills | Status: DC

## 2020-09-10 MED ORDER — POLYETHYLENE GLYCOL 3350 17 G PO PACK: 17.0000 g | PACK | Freq: Every day | ORAL | 0 refills | Status: DC | PRN

## 2020-09-10 MED ORDER — HEPARIN SOD (PORK) LOCK FLUSH 100 UNIT/ML IV SOLN
500.0000 [IU] | INTRAVENOUS | Status: AC | PRN
  Administered 2020-09-10: 500 [IU]
  Filled 2020-09-10: qty 5

## 2020-09-10 MED ORDER — CEFDINIR 300 MG PO CAPS: 300.0000 mg | ORAL_CAPSULE | Freq: Two times a day (BID) | ORAL | 0 refills | Status: AC

## 2020-09-10 NOTE — Discharge Summary (Signed)
Physician Discharge Summary  Jesse Frye [id number] DOB: 31-Dec-1955 DOA: 09/05/2020  PCP: System, Provider Not In  Admit date: 09/05/2020 Discharge date: 09/10/2020  Admitted From: Home Disposition:  Home  Discharge Condition:Stable CODE STATUS:FULL Diet recommendation: Regular   Brief/Interim Summary:  65 year old Hispanic gentleman has no significant past medical history recently developed abdominal pain was seen in the ED on December 21 while CT abdomen showed "a heterogeneous mass in the head of the pancreas with findings suspicious for extent of local tumor involvement and nodal disease as well as peritoneal disease" He is in the process of getting cancer work-up outpatient, however developed abdominal distention, intractable nausea and vomiting, He says that over the last 4 days, he's had N/V which has increased to the point that he is not able to eat, he presented to [facility] ED, found to have hyponatremia, ascites, paracentesis ordered in the ED, GI consulted , oncology notified.  He underwent paracentesis during his hospitalization more than once.  Ascitic  fluid showed elevated white cells but gram stain was negative, he was started on antibiotic.  His abdomen, nausea and vomiting have improved.  Oncology recommended outpatient follow-up for chemotherapy.  He is hemodynamically stable for discharge home today  Following problems were addressed during his hospitalization:   Ascites - GI state cytology reviewed malignant cells, I could not locate the origin pathology report -Status post paracentesis removed 6.1 L, fluid study total nucleated cells 2344, Gram stain no organisms seen, culture no growth so far -GI recommended abx treatment for SBP for at least 10 days due to elevated WBC in ascites fluids, he was initially treated with Rocephin, change to oral  - Repeat therapeutic paracentesis with 2liters removed on 1/7 ,repeat paracentesis on 1/9 with albumin x1 , another  2liter removed -Might need peritoneal drain in the future.  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 thrombus in the S MV with high-grade narrowing, suspected superimposed bland thrombus in the portal vein just about SMV thrombus  Pancreatic mass is unresectable, not amiable to IR biopsy CA 19-9 elevated at 68 GI state cytology reviewed malignant cells, I could not locate the origin pathology report, detail please refer to GI notes from 1/5 CT chest with contrast did not reveal metastasis in chest S/p Port placement  Oncology following for initiating chemotherapy  Nausea and vomiting None since in the hospital, as needed antiemetics Currently tolerating diet  Hyponatremia Sodium 123 on presentation, improved . Check BMP in a week  Subacute fracture of RIGHT seventh rib, incidental finding on CT scan Denies chest pain, encouraged  incentive spirometer  Constipation:  Continue bowel regimen     Discharge Diagnoses:  Active Problems:   Intractable nausea and vomiting   Ascites   Malignant neoplasm of pancreas [facility])   Generalized abdominal pain    Discharge Instructions  Discharge Instructions    Diet general   Complete by: As directed    Discharge instructions   Complete by: As directed    1)Take prescribed  medications as instructed 2)Follow up with Oncology as an outpatient.  You will be called for appointment 3)Follow up with a PCP in a week.  Do a BMP test during the follow-up   Increase activity slowly   Complete by: As directed    No wound care   Complete by: As directed      Allergies as of 09/10/2020      Reactions  Penicillins Shortness Of Breath, Other (See Comments)   Childhood reaction Other reaction(s): Dizziness Tolerated amoxicillin in 2016 and Augmentin in 2018      Medication List    STOP taking these medications   traMADol 50 MG tablet Commonly known as: ULTRAM     TAKE these  medications   cefdinir 300 MG capsule Commonly known as: OMNICEF Take 1 capsule (300 mg total) by mouth every 12 (twelve) hours for 6 days.   ondansetron 4 MG tablet Commonly known as: ZOFRAN Take 1 tablet (4 mg total) by mouth every 6 (six) hours as needed for nausea.   oxyCODONE 5 MG immediate release tablet Commonly known as: Oxy IR/ROXICODONE Take 1 tablet (5 mg total) by mouth every 4 (four) hours as needed for breakthrough pain.   polyethylene glycol 17 g packet Commonly known as: MIRALAX / GLYCOLAX Take 17 g by mouth daily as needed.   senna-docusate 8.6-50 MG tablet Commonly known as: Senokot-S Take 1 tablet by mouth 2 (two) times daily.       Follow-up Information    Wyatt Portela, MD. Schedule an appointment as soon as possible for a visit in 1 week(s).   Specialty: Oncology Why: You will be called for apppointment Contact information: [address] [place] [address] [phone]              Allergies  Allergen Reactions   Penicillins Shortness Of Breath and Other (See Comments)    Childhood reaction Other reaction(s): Dizziness Tolerated amoxicillin in 2016 and Augmentin in 2018    Consultations:  Oncology   Procedures/Studies: CT CHEST W CONTRAST  Result Date: 09/07/2020 CLINICAL DATA:  Pancreatic carcinoma. EXAM: CT CHEST WITH CONTRAST TECHNIQUE: Multidetector CT imaging of the chest was performed during intravenous contrast administration. CONTRAST:  48mL OMNIPAQUE IOHEXOL 300 MG/ML  SOLN COMPARISON:  Abdomen CT on 08/21/2020; no prior chest CT FINDINGS: Cardiovascular: No acute findings. Aortic atherosclerotic calcification noted. Mediastinum/Nodes: No masses or pathologically enlarged lymph nodes identified. Lungs/Pleura: Linear opacity in both lower lobes is consistent with subsegmental atelectasis. No evidence of pulmonary infiltrate or pleural effusion. A 3 mm pulmonary nodule is seen in the medial right upper lobe on image  33/5, and a 3 mm nodule is seen in the left lung apex on image 18/5, which are nonspecific but relatively unlikely to represent metastatic disease. Upper Abdomen: Large pancreatic soft tissue mass in the central upper abdomen has increased since previous study. There is new perihepatic ascites and diffuse omental soft tissue stranding, consistent with worsening peritoneal carcinomatosis. Musculoskeletal:  No suspicious bone lesions. IMPRESSION: No definite metastatic disease within the thorax. 2 tiny bilateral upper lobe pulmonary nodules are nonspecific, but unlikely represent metastatic disease. Recommend continued attention on follow-up imaging. Bilateral lower lobe subsegmental atelectasis. Increased size of large pancreatic soft tissue mass and worsening peritoneal carcinomatosis and perihepatic ascites. Aortic Atherosclerosis (ICD10-I70.0). Electronically Signed   By: Marlaine Hind M.D.   On: 09/07/2020 19:40   CT Abdomen Pelvis W Contrast  Addendum Date: 08/21/2020   ADDENDUM REPORT: 08/21/2020 12:12 ADDENDUM: Findings were discussed via telephone at the time of dictation given SMV narrowing suspected superimposed acute thrombus lactate was suggested for correlation to exclude developing venous ischemia though bowel enhancement is preserved and symmetric on today's study. Origin of suspected neoplasm may be upper gastrointestinal versus pancreatic based on the appearance of the gastric antrum which is also inseparable from soft tissue in the lesser sac. These results were called by telephone  at the time of interpretation on 08/21/2020 at 12:12 pm to provider [facility] , who verbally acknowledged these results. Electronically Signed   By: Zetta Bills M.D.   On: 08/21/2020 12:12   Result Date: 08/21/2020 CLINICAL DATA:  Acute abdominal pain for 8 days, diffuse abdominal pain EXAM: CT ABDOMEN AND PELVIS WITH CONTRAST TECHNIQUE: Multidetector CT imaging of the abdomen and pelvis was performed using  the standard protocol following bolus administration of intravenous contrast. CONTRAST:  123mL OMNIPAQUE IOHEXOL 300 MG/ML  SOLN COMPARISON:  None FINDINGS: Lower chest: No consolidation.  No pleural effusion. Hepatobiliary: Heterogeneous appearance of the liver which in general displays low attenuation. Periportal enhancement and some areas of low attenuation extending into the peripheral liver substance more so in posterior division RIGHT portal distribution than in other areas but also in anterior RIGHT. No discrete hepatic lesion aside from a small hypervascular focus in the RIGHT hepatic lobe on image 20 of series 3 that measures 7 mm. Suspected transient hepatic attenuation difference along the anterolateral segment of the LEFT hepatic lobe. No pericholecystic stranding. No biliary duct dilation. Portal vein is highly narrowed at the SMV/portal confluence with an area of low attenuation in the lumen compatible with portal thrombus. Pancreas: Large heterogeneous masslike area in the neck and head of the pancreas anteriorly measuring 4.8 x 2.6 cm. No signs of significant pancreatic ductal dilation. Infiltration into the transverse mesocolon, soft tissue and nodularity extending into the transverse mesocolon on image 28 of series 3. Also with soft tissue density extending into the hepatic gastric recess of the lesser sac, this area measuring approximately 4.3 x 4.1 cm. No sign of gas in the lesser sac. Spleen: Normal Adrenals/Urinary Tract: Adrenal glands are normal. Symmetric renal enhancement. No hydronephrosis. Urinary bladder is smooth in terms of contour. No focal abnormality Stomach/Bowel: Perigastric stranding.  Some gastric wall thickening. Small bowel with stranding in the area of the ligament of Treitz. Masslike area with low attenuation just above the fourth portion of the duodenum best seen on coronal image 77 of series 6 measuring approximately 5.5 cm greatest coronal dimension with 2.6 cm greatest  thickness in the axial plane. Colon with diverticular disease. Mild mesenteric edema in the LEFT hemiabdomen and small volume ascites in the pelvis. Vascular/Lymphatic: Calcified and noncalcified atheromatous plaque in the abdominal aorta. No aneurysmal dilation. Soft tissue surrounds the SMA at the root of the small bowel mesentery image 27 of series 3. Hazy stranding contact approximately 360 degrees with soft tissue showing subtle low-density fat plane in some areas but more pronounced stranding and soft tissue on image 28 of series 3 suggests vascular involvement, also associated with suspected portal invasion. 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 Suspected superimposed bland thrombus in the portal vein just above this area. These are best captured on delayed images 21 through 16 Reproductive: Prostate heterogeneous. The no pelvic lymphadenopathy. Other: No free-air. Infiltration of the omentum with subtle stranding suspicious for peritoneal disease. This is seen along the anterior abdomen best seen on images 31 of series 3 just below the liver and gallbladder fossa. Also associated with ascites in the pelvis, small volume. Musculoskeletal: Subacute fracture of RIGHT seventh rib. No acute bone finding or signs of destructive bone process. IMPRESSION: 1. Heterogeneous masslike area in the head of the pancreas with findings that are suspicious for extensive local tumor involvement, nodal disease and peritoneal disease. 2. 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 3. Mild mesenteric edema versus tumor infiltration in the LEFT upper quadrant. Correlation with lactate may be helpful in this patient with near occlusion of the SMV. Some collateral pathways are present but a small amount of superimposed acute thrombosis seen on today's  study could pose a hazard in this patient. 4. Subtle focus of arterial phase enhancement along the hepatic margin raising the question of hepatic metastatic disease. After resolution of acute symptoms would consider liver MR, preferably when the patient is able to cooperate with breath hold instructions, perhaps as an outpatient. 5. Hazy stranding contact about the SMA which on coronal images shows a fat plane but with extensive involvement with soft tissue at the root of the small bowel mesentery and frank invasion of the SMV as described. 6. Subacute fracture of RIGHT seventh rib. 7. Small volume ascites in the pelvis. 8. Aortic atherosclerosis. A call is out to the referring provider to further discuss findings in the above case. Aortic Atherosclerosis (ICD10-I70.0). Electronically Signed: By: Donzetta KohutGeoffrey  Wile M.D. On: 08/21/2020 12:00   US Paracentesis  Result Date: 09/09/2020 INDICATION: Patient with history of pancreatic mass, recurrent ascites. Request is made for diagnostic and therapeutic paracentesis. EXAM: ULTRASOUND GUIDED DIAGNOSTIC AND THERAPEUTIC PARACENTESIS MEDICATIONS: 10 mL 1% lidocaine COMPLICATIONS: None immediate. PROCEDURE: Informed written consent was obtained from the patient after a discussion of the risks, benefits and alternatives to treatment. A timeout was performed prior to the initiation of the procedure. Initial ultrasound scanning demonstrates a small amount of ascites within the right lower abdominal quadrant. The right lower abdomen was prepped and draped in the usual sterile fashion. 1% lidocaine was used for local anesthesia. Following this, a 19 gauge, 7-cm, Yueh catheter was introduced. An ultrasound image was saved for documentation purposes. The paracentesis was performed. The catheter was removed and a dressing was applied. The patient tolerated the procedure well without immediate post procedural complication. FINDINGS: A total of approximately 2.0 liters of bloody fluid  was removed. Samples were sent to the laboratory as requested by the clinical team. IMPRESSION: Successful ultrasound-guided paracentesis yielding 2.0 liters of peritoneal fluid. Read by: Loyce DysKacie Matthews PA-C Electronically Signed   By: Judie PetitM.  Shick M.D.   On: 09/09/2020 11:50   US Paracentesis  Result Date: 09/05/2020 INDICATION: Patient with history of abdominal pain, nausea, vomiting, abdominal distension/ascites, pancreatic mass; request received for diagnostic and therapeutic paracentesis. EXAM: ULTRASOUND GUIDED DIAGNOSTIC AND THERAPEUTIC PARACENTESIS MEDICATIONS: 1% lidocaine to skin and subcutaneous tissue COMPLICATIONS: None immediate. PROCEDURE: Informed written consent was obtained from the patient after a discussion of the risks, benefits and alternatives to treatment. A timeout was performed prior to the initiation of the procedure. Initial ultrasound scanning demonstrates a large amount of ascites within the right lower abdominal quadrant. The right lower abdomen was prepped and draped in the usual sterile fashion. 1% lidocaine was used for local anesthesia. Following this, a 19 gauge, 10-cm, Yueh catheter was introduced. An ultrasound image was saved for documentation purposes. The paracentesis was performed. The catheter was removed and a dressing was applied. The patient tolerated the procedure well without immediate post procedural complication. FINDINGS: A total of approximately 6.1 liters of hazy, slightly blood tinged/amber fluid was removed. Samples were sent to the laboratory as requested by the clinical team. IMPRESSION: Successful ultrasound-guided diagnostic and therapeutic paracentesis yielding 6.1 liters of peritoneal fluid. Read by: Jeananne RamaKevin Allred, PA-C Electronically Signed  By: Aletta Edouard M.D.   On: 09/05/2020 16:21   DG Abdomen Acute W/Chest  Result Date: 09/05/2020 CLINICAL DATA:  Nausea, vomiting, loss of appetite and abdominal distension developing over the past few days.  EXAM: DG ABDOMEN ACUTE WITH 1 VIEW CHEST COMPARISON:  PA and lateral chest 04/23/2028. CT abdomen and pelvis 08/21/2020. FINDINGS: Single-view of the chest demonstrates subsegmental atelectasis in the lower lung zones. No pneumothorax or pleural fluid. Heart size is normal. Aortic atherosclerosis. Two views of the abdomen show no free intraperitoneal air. The bowel gas pattern is nonobstructive. No acute or focal bony abnormality. IMPRESSION: No acute abnormality chest or abdomen. Electronically Signed   By: Inge Rise M.D.   On: 09/05/2020 11:26   IR IMAGING GUIDED PORT INSERTION  Result Date: 09/07/2020 INDICATION: 65 year old with a pancreatic mass and peritoneal disease. Port-A-Cath needed for chemotherapy. EXAM: FLUOROSCOPIC AND ULTRASOUND GUIDED PLACEMENT OF A SUBCUTANEOUS PORT COMPARISON:  None. MEDICATIONS: Patient is receiving antibiotics as inpatient and additional antibiotics were not given. ANESTHESIA/SEDATION: Versed 3 mg IV; Fentanyl 100 mcg IV; Moderate Sedation Time:  1 hour and 29 minutes The patient was continuously monitored during the procedure by the interventional radiology nurse under my direct supervision. FLUOROSCOPY TIME:  1 minutes, 30 seconds, 8 mGy CONTRAST:  10 mL Omnipaque 161 COMPLICATIONS: None immediate. PROCEDURE: The procedure, risks, benefits, and alternatives were explained to the patient. Questions regarding the procedure were encouraged and answered. The patient understands and consents to the procedure. Ultrasound-guided paracentesis was performed immediately prior to Port-A-Cath placement. Peritoneal fluid was draining throughout the port placement. Patient was placed supine on the interventional table. Ultrasound confirmed a patent right internal jugular vein. Ultrasound image was saved for documentation. The right chest and neck were cleaned with a skin antiseptic and a sterile drape was placed. Maximal barrier sterile technique was utilized including caps, mask,  sterile gowns, sterile gloves, sterile drape, hand hygiene and skin antiseptic. The right neck was anesthetized with 1% lidocaine. Small incision was made in the right neck with a blade. Micropuncture set was placed in the right internal jugular vein with ultrasound guidance. The micropuncture wire was used for measurement purposes. The right chest was anesthetized with 1% lidocaine with epinephrine. #15 blade was used to make an incision and a subcutaneous port pocket was formed. [place] was assembled. Subcutaneous tunnel was formed with a stiff tunneling device. The port catheter was brought through the subcutaneous tunnel. The port was placed in the subcutaneous pocket. The micropuncture set was exchanged for a peel-away sheath. The catheter was placed through the peel-away sheath and the tip was positioned at the superior cavoatrial junction. Catheter placement was confirmed with fluoroscopy. The port was accessed and flushed with saline. The port pocket was closed using two layers of absorbable sutures and Dermabond. The vein skin site was closed using a single layer of absorbable suture and Dermabond. The port was accessed again. Unfortunately, the port would no longer aspirate. A new access needle was tried but again the port was not aspirating. Contrast injection confirmed that the catheter was well positioned at the superior cavoatrial junction. Fluoroscopic evaluation suggested that the port was intermittently flipping up against the vein wall. Port function was not acceptable, therefore, the Dermabond was removed over the port insertion. The incision was opened up. The port was pulled out of the pocket. Even after some repositioning, the port was consistently flipping up against the vein wall. Therefore, the port was taken apart and  the catheter was completely removed over a stiff Glidewire. A new port catheter was advanced over the wire and positioned in the right atrium. The catheter was  aspirating well within the right atrium. Therefore, the port was reassembled. Port was placed back in the pocket. Pocket was closed using 2 layers of absorbable suture and Dermabond. Port was accessed again and found to aspirate and flush well. The port was flushed with normal saline. FINDINGS: Initial placement of the Port-A-Cath was at the superior cavoatrial junction. The catheter was not aspirating well at the superior cavoatrial junction. As a result, a new port was placed and positioned in the right atrium. The port aspirates and flushes well in the right atrium. IMPRESSION: Placement of a subcutaneous port device. Electronically Signed   By: Markus Daft M.D.   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 Patient complains of abdominal pain and distension. EXAM: ULTRASOUND GUIDED  PARACENTESIS MEDICATIONS: None. COMPLICATIONS: None immediate. PROCEDURE: Informed written consent was obtained from the patient after a discussion of the risks, benefits and alternatives to treatment. A timeout was performed prior to the initiation of the procedure. Initial ultrasound scanning demonstrates a large amount of ascites within the right lower abdominal quadrant. The right lower abdomen was prepped and draped in the usual sterile fashion. 1% lidocaine was used for local anesthesia. Following this, a 6 Fr Safe-T-Centesis catheter was introduced. An ultrasound image was saved for documentation purposes. The paracentesis was performed. The catheter was removed and a dressing was applied. The patient tolerated the procedure well without immediate post procedural complication. Patient received post-procedure intravenous albumin; see nursing notes for details. FINDINGS: A total of approximately 2.9 L of red fluid was removed. IMPRESSION: Successful ultrasound-guided paracentesis yielding 2.9 liters of peritoneal fluid.  Electronically Signed   By: Markus Daft M.D.   On: 09/07/2020 18:06       Subjective: Patient seen and examined at bedside this morning.  Hemodynamically stable for discharge today to home.  Discharge Exam: Vitals:   09/09/20 2204 09/10/20 0617  BP: 101/63 112/80  Pulse: 97 89  Resp: 16 18  Temp: 98.5 F (36.9 C) 98.3 F (36.8 C)  SpO2: 97% 99%   Vitals:   09/09/20 0949 09/09/20 1422 09/09/20 2204 09/10/20 0617  BP: 113/83 123/84 101/63 112/80  Pulse:  100 97 89  Resp:  16 16 18   Temp:  98.1 F (36.7 C) 98.5 F (36.9 C) 98.3 F (36.8 C)  TempSrc:  Oral Oral Oral  SpO2:  99% 97% 99%  Weight:      Height:        General: Pt is alert, awake, not in acute distress Cardiovascular: RRR, S1/S2 +, no rubs, no gallops Respiratory: CTA bilaterally, no wheezing, no rhonchi Abdominal: Soft, distended, ND, bowel sounds +, ascites Extremities: no edema, no cyanosis    The results of significant diagnostics from this hospitalization (including imaging, microbiology, ancillary and laboratory) are listed below for reference.     Microbiology: Recent Results (from the past 240 hour(s))  Resp Panel by RT-PCR (Flu A&B, Covid) Nasopharyngeal Swab     Status: None   Collection Time: 09/05/20 11:31 AM   Specimen: Nasopharyngeal Swab; Nasopharyngeal(NP) swabs in vial transport medium  Result Value Ref Range Status   SARS Coronavirus 2 by RT PCR NEGATIVE NEGATIVE Final    Comment: (NOTE) SARS-CoV-2 target nucleic acids are NOT DETECTED.  The  SARS-CoV-2 RNA is generally detectable in upper respiratory specimens during the acute phase of infection. The lowest concentration of SARS-CoV-2 viral copies this assay can detect is 138 copies/mL. A negative result does not preclude SARS-Cov-2 infection and should not be used as the sole basis for treatment or other patient management decisions. A negative result may occur with  improper specimen collection/handling, submission of specimen  other than nasopharyngeal swab, presence of viral mutation(s) within the areas targeted by this assay, and inadequate number of viral copies(<138 copies/mL). A negative result must be combined with clinical observations, patient history, and epidemiological information. The expected result is Negative.  Fact Sheet for Patients:  EntrepreneurPulse.com.au  Fact Sheet for Healthcare Providers:  IncredibleEmployment.be  This test is no t yet approved or cleared by the [place] FDA and  has been authorized for detection and/or diagnosis of SARS-CoV-2 by FDA under an Emergency Use Authorization (EUA). This EUA will remain  in effect (meaning this test can be used) for the duration of the COVID-19 declaration under Section 564(b)(1) of the Act, 21 U.S.C.section 360bbb-3(b)(1), unless the authorization is terminated  or revoked sooner.       Influenza A by PCR NEGATIVE NEGATIVE Final   Influenza B by PCR NEGATIVE NEGATIVE Final    Comment: (NOTE) The Xpert Xpress SARS-CoV-2/FLU/RSV plus assay is intended as an aid in the diagnosis of influenza from Nasopharyngeal swab specimens and should not be used as a sole basis for treatment. Nasal washings and aspirates are unacceptable for Xpert Xpress SARS-CoV-2/FLU/RSV testing.  Fact Sheet for Patients: EntrepreneurPulse.com.au  Fact Sheet for Healthcare Providers: IncredibleEmployment.be  This test is not yet approved or cleared by the [place] FDA and has been authorized for detection and/or diagnosis of SARS-CoV-2 by FDA under an Emergency Use Authorization (EUA). This EUA will remain in effect (meaning this test can be used) for the duration of the COVID-19 declaration under Section 564(b)(1) of the Act, 21 U.S.C. section 360bbb-3(b)(1), unless the authorization is terminated or revoked.  Performed at [facility], [place] [address] [place], [place] [address]   Body fluid culture     Status: None   Collection Time: 09/05/20  3:42 PM   Specimen: PATH Cytology Peritoneal fluid  Result Value Ref Range Status   Specimen Description   Final    PERITONEAL Performed at [facility] [address] [place], [place] [address]    Special Requests   Final    NONE Performed at [facility], [place] [address] [place], [place] [address]    Gram Stain   Final    ABUNDANT WBC PRESENT, PREDOMINANTLY MONONUCLEAR NO ORGANISMS SEEN    Culture   Final    NO GROWTH 3 DAYS Performed at [facility] [address], [place], [place] [address]    Report Status 09/09/2020 FINAL  Final  Surgical pcr screen     Status: None   Collection Time: 09/06/20  5:30 AM   Specimen: Nasal Mucosa; Nasal Swab  Result Value Ref Range Status   MRSA, PCR NEGATIVE NEGATIVE Final   Staphylococcus aureus NEGATIVE NEGATIVE Final    Comment: (NOTE) The Xpert SA Assay (FDA approved for NASAL specimens in patients 37 years of age and older), is one component of a comprehensive surveillance program. It is not intended to diagnose infection nor to guide or monitor treatment. Performed at [facility], [place] [address] [place], [place] [address]   Gram  stain     Status: None   Collection Time: 09/09/20  9:43 AM   Specimen: Peritoneal Washings  Result Value Ref Range Status   Specimen Description PERITONEAL  Final   Special Requests NONE  Final   Gram Stain   Final    CYTOSPIN SMEAR WBC PRESENT,BOTH PMN AND MONONUCLEAR NO ORGANISMS SEEN Performed at [facility], [address], [place], [place] [address]    Report Status 09/09/2020 FINAL  Final     Labs: BNP (last 3 results) No results for input(s): BNP in the last 8760 hours. Basic Metabolic Panel: Recent Labs  Lab 09/06/20 0608 09/07/20 0535 09/08/20 0543 09/09/20 0522 09/10/20 0637  NA 127* 131* 131* 130* 127*  K  4.1 4.5 4.7 4.7 4.6  CL 95* 96* 98 95* 95*  CO2 21* 25 23 24  21*  GLUCOSE 69* 127* 121* 100* 126*  BUN 19 20 23 22 23   CREATININE 0.73 0.85 0.93 0.94 0.74  CALCIUM 7.7* 8.3* 8.1* 8.1* 8.2*  MG  --  2.2  --   --  2.0   Liver Function Tests: Recent Labs  Lab 09/05/20 1131 09/05/20 1446 09/06/20 0608 09/07/20 0535  AST 26  --  20 21  ALT 22  --  18 18  ALKPHOS 55  --  45 52  BILITOT 1.1  --  1.3* 0.8  PROT 6.6 6.3* 5.9* 6.2*  ALBUMIN 3.0*  --  2.7* 2.8*   Recent Labs  Lab 09/05/20 1131  LIPASE 22   No results for input(s): AMMONIA in the last 168 hours. CBC: Recent Labs  Lab 09/05/20 1131 09/06/20 0608 09/09/20 0522  WBC 11.1* 10.4 9.2  NEUTROABS 9.3*  --   --   HGB 13.2 12.8* 12.9*  HCT 38.5* 37.7* 38.3*  MCV 90.8 93.1 92.1  PLT 400 390 374   Cardiac Enzymes: No results for input(s): CKTOTAL, CKMB, CKMBINDEX, TROPONINI in the last 168 hours. BNP: Invalid input(s): POCBNP CBG: No results for input(s): GLUCAP in the last 168 hours. D-Dimer No results for input(s): DDIMER in the last 72 hours. Hgb A1c No results for input(s): HGBA1C in the last 72 hours. Lipid Profile No results for input(s): CHOL, HDL, LDLCALC, TRIG, CHOLHDL, LDLDIRECT in the last 72 hours. Thyroid function studies No results for input(s): TSH, T4TOTAL, T3FREE, THYROIDAB in the last 72 hours.  Invalid input(s): FREET3 Anemia work up No results for input(s): VITAMINB12, FOLATE, FERRITIN, TIBC, IRON, RETICCTPCT in the last 72 hours. Urinalysis    Component Value Date/Time   COLORURINE YELLOW 08/21/2020 [place] 08/21/2020 0651   LABSPEC 1.013 08/21/2020 0651   PHURINE 5.0 08/21/2020 0651   GLUCOSEU NEGATIVE 08/21/2020 0651   HGBUR NEGATIVE 08/21/2020 0651   BILIRUBINUR NEGATIVE 08/21/2020 0651   KETONESUR NEGATIVE 08/21/2020 0651   PROTEINUR NEGATIVE 08/21/2020 0651   UROBILINOGEN 0.2 02/12/2015 1224   NITRITE NEGATIVE 08/21/2020 0651   LEUKOCYTESUR TRACE (A)  08/21/2020 0651   Sepsis Labs Invalid input(s): PROCALCITONIN,  WBC,  LACTICIDVEN Microbiology Recent Results (from the past 240 hour(s))  Resp Panel by RT-PCR (Flu A&B, Covid) Nasopharyngeal Swab     Status: None   Collection Time: 09/05/20 11:31 AM   Specimen: Nasopharyngeal Swab; Nasopharyngeal(NP) swabs in vial transport medium  Result Value Ref Range Status   SARS Coronavirus 2 by RT PCR NEGATIVE NEGATIVE Final    Comment: (NOTE) SARS-CoV-2 target nucleic acids are NOT DETECTED.  The SARS-CoV-2 RNA is generally detectable in  upper respiratory specimens during the acute phase of infection. The lowest concentration of SARS-CoV-2 viral copies this assay can detect is 138 copies/mL. A negative result does not preclude SARS-Cov-2 infection and should not be used as the sole basis for treatment or other patient management decisions. A negative result may occur with  improper specimen collection/handling, submission of specimen other than nasopharyngeal swab, presence of viral mutation(s) within the areas targeted by this assay, and inadequate number of viral copies(<138 copies/mL). A negative result must be combined with clinical observations, patient history, and epidemiological information. The expected result is Negative.  Fact Sheet for Patients:  EntrepreneurPulse.com.au  Fact Sheet for Healthcare Providers:  IncredibleEmployment.be  This test is no t yet approved or cleared by the [place] FDA and  has been authorized for detection and/or diagnosis of SARS-CoV-2 by FDA under an Emergency Use Authorization (EUA). This EUA will remain  in effect (meaning this test can be used) for the duration of the COVID-19 declaration under Section 564(b)(1) of the Act, 21 U.S.C.section 360bbb-3(b)(1), unless the authorization is terminated  or revoked sooner.       Influenza A by PCR NEGATIVE NEGATIVE Final   Influenza B by PCR NEGATIVE  NEGATIVE Final    Comment: (NOTE) The Xpert Xpress SARS-CoV-2/FLU/RSV plus assay is intended as an aid in the diagnosis of influenza from Nasopharyngeal swab specimens and should not be used as a sole basis for treatment. Nasal washings and aspirates are unacceptable for Xpert Xpress SARS-CoV-2/FLU/RSV testing.  Fact Sheet for Patients: EntrepreneurPulse.com.au  Fact Sheet for Healthcare Providers: IncredibleEmployment.be  This test is not yet approved or cleared by the [place] FDA and has been authorized for detection and/or diagnosis of SARS-CoV-2 by FDA under an Emergency Use Authorization (EUA). This EUA will remain in effect (meaning this test can be used) for the duration of the COVID-19 declaration under Section 564(b)(1) of the Act, 21 U.S.C. section 360bbb-3(b)(1), unless the authorization is terminated or revoked.  Performed at [facility], [place] [address], [place], [place] [address]   Body fluid culture     Status: None   Collection Time: 09/05/20  3:42 PM   Specimen: PATH Cytology Peritoneal fluid  Result Value Ref Range Status   Specimen Description   Final    PERITONEAL Performed at [place] [address], [place], [place] [address]    Special Requests   Final    NONE Performed at [facility], [place] [address] [place], [place] [address]    Gram Stain   Final    ABUNDANT WBC PRESENT, PREDOMINANTLY MONONUCLEAR NO ORGANISMS SEEN    Culture   Final    NO GROWTH 3 DAYS Performed at [facility] [address] [place], [place] [address]    Report Status 09/09/2020 FINAL  Final  Surgical pcr screen     Status: None   Collection Time: 09/06/20  5:30 AM   Specimen: Nasal Mucosa; Nasal Swab  Result Value Ref Range Status   MRSA, PCR NEGATIVE NEGATIVE Final   Staphylococcus aureus NEGATIVE NEGATIVE Final    Comment: (NOTE) The Xpert SA Assay (FDA  approved for NASAL specimens in patients 96 years of age and older), is one component of a comprehensive surveillance program. It is not intended to diagnose infection nor to guide or monitor treatment. Performed at [facility], [place] [address] [place], [place] [address]   Gram stain     Status:  None   Collection Time: 09/09/20  9:43 AM   Specimen: Peritoneal Washings  Result Value Ref Range Status   Specimen Description PERITONEAL  Final   Special Requests NONE  Final   Gram Stain   Final    CYTOSPIN SMEAR WBC PRESENT,BOTH PMN AND MONONUCLEAR NO ORGANISMS SEEN Performed at [facility], [address], [place], [place] [address]    Report Status 09/09/2020 FINAL  Final    Please note: You were cared for by a hospitalist during your hospital stay. Once you are discharged, your primary care physician will handle any further medical issues. Please note that NO REFILLS for any discharge medications will be authorized once you are discharged, as it is imperative that you return to your primary care physician (or establish a relationship with a primary care physician if you do not have one) for your post hospital discharge needs so that they can reassess your need for medications and monitor your lab values.    Time coordinating discharge: 40 minutes  SIGNED:   Shelly Coss, MD  [facility] 09/10/2020, 8:28 AM Pager [phone]  If 7PM-7AM, please contact night-coverage [email or web address] Password [email or web address]

## 2020-09-10 NOTE — Progress Notes (Signed)
IP PROGRESS NOTE  Subjective:   No major complaints noted by Jesse Frye.  Still has some mild lower abdominal pain which is not surprising at this time.  His ascites has improved after another paracentesis.  No fevers chills or sweats.  Denies any vomiting abdominal distention.   Objective:  Vital signs in last 24 hours: Temp:  [98.1 F (36.7 C)-98.5 F (36.9 C)] 98.3 F (36.8 C) (01/10 0617) Pulse Rate:  [89-100] 89 (01/10 0617) Resp:  [16-18] 18 (01/10 0617) BP: (101-123)/(63-86) 112/80 (01/10 0617) SpO2:  [97 %-99 %] 99 % (01/10 0617) Weight change:  Last BM Date: 09/09/20  Intake/Output from previous day: 01/09 0701 - 01/10 0700 In: 1150 [P.O.:1050; IV Piggyback:100] Out: 1000 [Urine:1000]    General appearance: Comfortable appearing without any discomfort Head: Normocephalic without any trauma Oropharynx: Mucous membranes are moist and pink without any thrush or ulcers. Eyes: Pupils are equal and round reactive to light. Lymph nodes: No cervical, supraclavicular, inguinal or axillary lymphadenopathy.   Heart:regular rate and rhythm.  S1 and S2 without leg edema. Lung: Clear without any rhonchi or wheezes.  No dullness to percussion. Abdomin:  Slightly distended without any rebound or guarding. Musculoskeletal: No joint deformity or effusion.  Full range of motion noted. Neurological: No deficits noted on motor, sensory and deep tendon reflex exam. Skin: No petechial rash or dryness.  Appeared moist.      Lab Results: Recent Labs    09/09/20 0522  WBC 9.2  HGB 12.9*  HCT 38.3*  PLT 374    BMET Recent Labs    09/09/20 0522 09/10/20 0637  NA 130* 127*  K 4.7 4.6  CL 95* 95*  CO2 24 21*  GLUCOSE 100* 126*  BUN 22 23  CREATININE 0.94 0.74  CALCIUM 8.1* 8.2*    CA 19-9: 68  Studies/Results: US Paracentesis  Result Date: 09/09/2020 INDICATION: Patient with history of pancreatic mass, recurrent ascites. Request is made for diagnostic and therapeutic  paracentesis. EXAM: ULTRASOUND GUIDED DIAGNOSTIC AND THERAPEUTIC PARACENTESIS MEDICATIONS: 10 mL 1% lidocaine COMPLICATIONS: None immediate. PROCEDURE: Informed written consent was obtained from the patient after a discussion of the risks, benefits and alternatives to treatment. A timeout was performed prior to the initiation of the procedure. Initial ultrasound scanning demonstrates a small amount of ascites within the right lower abdominal quadrant. The right lower abdomen was prepped and draped in the usual sterile fashion. 1% lidocaine was used for local anesthesia. Following this, a 19 gauge, 7-cm, Yueh catheter was introduced. An ultrasound image was saved for documentation purposes. The paracentesis was performed. The catheter was removed and a dressing was applied. The patient tolerated the procedure well without immediate post procedural complication. FINDINGS: A total of approximately 2.0 liters of bloody fluid was removed. Samples were sent to the laboratory as requested by the clinical team. IMPRESSION: Successful ultrasound-guided paracentesis yielding 2.0 liters of peritoneal fluid. Read by: Brynda Greathouse PA-C Electronically Signed   By: Jerilynn Mages.  Shick M.D.   On: 09/09/2020 11:50    Medications: I have reviewed the patient's current medications.  Assessment/Plan:  65 year old man with:  1.  Stage IV pancreatic cancer presented with ascites and peritoneal involvement in addition to elevated CA 19-9.  CT scan of the chest was discussed and showed no evidence of bowel clear-cut metastatic disease.  Risks and benefits of systemic chemotherapy utilizing gemcitabine and Abraxane were reviewed.  Complications: Nausea, vomiting, mild suppression, neutropenia and sepsis.  After discussion he is agreeable to proceed  and we will arrange for that to start soon as he is discharged.    Chemotherapy goal is palliative at this time which will offer improvement in his pain and ascites and potentially  improvement in his quality of life.  Alternative regimen such as FOLFIRINOX would likely be too toxic at this point.  Gemcitabine alone can also be used if he cannot tolerate the combination with Abraxane.    2.  Ascites: Related to pancreatic malignancy and with ascites drained on multiple occasions.   3.  IV access: Port-A-Cath inserted without any complications.   4.  Disposition follow-up: We will arrange to start of chemotherapy upon his discharge.  35  minutes were spent on this encounter.  More than 50% of time was dedicated to face-to-face encounter including review of his scans, chemotherapy discussion and future plan of care reviewed.    LOS: 5 days   Zola Button 09/10/2020, 7:47 AM

## 2020-09-10 NOTE — Progress Notes (Signed)
Discharge instructions given to patient and all questions were answered using interpreter [phone].

## 2020-09-10 NOTE — Progress Notes (Signed)
START ON PATHWAY REGIMEN - Pancreatic Adenocarcinoma     A cycle is every 28 days:     Nab-paclitaxel (protein bound)      Gemcitabine   **Always confirm dose/schedule in your pharmacy ordering system**  Patient Characteristics: Metastatic Disease, First Line, PS = 0,1, BRCA1/2 and PALB2  Mutation Absent/Unknown Therapeutic Status: Metastatic Disease Line of Therapy: First Line ECOG Performance Status: 1 BRCA1/2 Mutation Status: Quantity Not Sufficient PALB2 Mutation Status: Quantity Not Sufficient Intent of Therapy: Non-Curative / Palliative Intent, Discussed with Patient

## 2020-09-10 NOTE — TOC Transition Note (Signed)
Transition of Care [facility]) - CM/SW Discharge Note   Patient Details  Name: Jesse Frye MRN: [id number] Date of Birth: 23-Jul-1956  [facility] of [facility] CM/SW Contact:  Lia Hopping, Bee Phone Number: 09/10/2020, 10:13 AM   Clinical Narrative:    CSW met with the patient at bedside. Patient sister at bedside as well. Patient confirm her has a primary care physician at [facility]. Patient sees [employer]. Patient understands to make to a follow up appointment within the next two weeks.   Final next level of care: Home/Self Care Barriers to Discharge: No Barriers Identified   Patient Goals and CMS Choice        Discharge Placement                       Discharge Plan and Services                                     Social Determinants of Health (SDOH) Interventions     Readmission Risk Interventions No flowsheet data found.

## 2020-09-11 ENCOUNTER — Telehealth: Payer: Self-pay

## 2020-09-11 ENCOUNTER — Encounter: Payer: Self-pay | Admitting: *Deleted

## 2020-09-11 ENCOUNTER — Telehealth: Payer: Self-pay | Admitting: Oncology

## 2020-09-11 LAB — CYTOLOGY - NON PAP

## 2020-09-11 NOTE — Telephone Encounter (Signed)
Scheduled appt per 1/10 sch msg - pt daughter is aware of appts on schedule'

## 2020-09-11 NOTE — Telephone Encounter (Signed)
Appt has been cancelled as requested.

## 2020-09-11 NOTE — Telephone Encounter (Signed)
-----   Message from Irving Copas., MD sent at 09/10/2020  5:46 PM EST ----- Regarding: Upcoming procedure [name], This patient no longer needs EUS this week. His diagnosis was obtained during this recent hospitalization. Please cancel the upcoming procedure. Thank you. GM

## 2020-09-11 NOTE — Progress Notes (Signed)
[place] Work  Clinical Social Work was referred by GI navigator for assessment of psychosocial needs.  Clinical Social Worker contacted caregiver by phone  to offer support and assess for needs.  CSW spoke with patient's spouse and daughter, Lucas Mallow.    Patient lives in [place] with his spouse.  He has two supportive daughters, Lucas Mallow and Bursch.  Patient's spouse and daughter's main questions today included insurance coverage and access to care.  CSW made referral to [facility] inpatient financial navigation team- [facility]al agreed to reach out to patient/family to apply for CAFA and referral has been made to [employer] to see if he qualifies for emergency Medicaid. CSW made referral to [facility] patient resource specialist L. White to apply for [employer].  CSW provided contact information and will follow up with patient as needed.      Gwinda Maine, LCSW  Clinical Social Worker [facility]

## 2020-09-12 ENCOUNTER — Emergency Department (HOSPITAL_COMMUNITY): Payer: Self-pay

## 2020-09-12 ENCOUNTER — Telehealth: Payer: Self-pay

## 2020-09-12 ENCOUNTER — Encounter (HOSPITAL_COMMUNITY): Payer: Self-pay

## 2020-09-12 ENCOUNTER — Other Ambulatory Visit: Payer: Self-pay | Admitting: Oncology

## 2020-09-12 ENCOUNTER — Emergency Department (HOSPITAL_COMMUNITY)
Admission: EM | Admit: 2020-09-12 | Discharge: 2020-09-12 | Disposition: A | Discharge status: Home / Self Care | Payer: Self-pay | Attending: Emergency Medicine | Admitting: Emergency Medicine

## 2020-09-12 ENCOUNTER — Other Ambulatory Visit: Payer: Self-pay

## 2020-09-12 DIAGNOSIS — K8689 Other specified diseases of pancreas: Secondary | ICD-10-CM

## 2020-09-12 DIAGNOSIS — C259 Malignant neoplasm of pancreas, unspecified: Secondary | ICD-10-CM | POA: Insufficient documentation

## 2020-09-12 DIAGNOSIS — R188 Other ascites: Secondary | ICD-10-CM | POA: Insufficient documentation

## 2020-09-12 DIAGNOSIS — R52 Pain, unspecified: Secondary | ICD-10-CM

## 2020-09-12 DIAGNOSIS — R180 Malignant ascites: Secondary | ICD-10-CM

## 2020-09-12 DIAGNOSIS — Z87891 Personal history of nicotine dependence: Secondary | ICD-10-CM | POA: Insufficient documentation

## 2020-09-12 HISTORY — DX: Malignant (primary) neoplasm, unspecified: C80.1

## 2020-09-12 LAB — COMPREHENSIVE METABOLIC PANEL
ALT: 19 U/L (ref 0–44)
AST: 24 U/L (ref 15–41)
Albumin: 2.6 g/dL — ABNORMAL LOW (ref 3.5–5.0)
Alkaline Phosphatase: 50 U/L (ref 38–126)
Anion gap: 12 (ref 5–15)
BUN: 30 mg/dL — ABNORMAL HIGH (ref 8–23)
CO2: 21 mmol/L — ABNORMAL LOW (ref 22–32)
Calcium: 8.3 mg/dL — ABNORMAL LOW (ref 8.9–10.3)
Chloride: 95 mmol/L — ABNORMAL LOW (ref 98–111)
Creatinine, Ser: 0.93 mg/dL (ref 0.61–1.24)
GFR, Estimated: >60 mL/min (ref >60)
Glucose, Bld: 155 mg/dL — ABNORMAL HIGH (ref 70–99)
Potassium: 4.7 mmol/L (ref 3.5–5.1)
Sodium: 128 mmol/L — ABNORMAL LOW (ref 135–145)
Total Bilirubin: 0.5 mg/dL (ref 0.3–1.2)
Total Protein: 6.3 g/dL — ABNORMAL LOW (ref 6.5–8.1)

## 2020-09-12 LAB — CBC WITH DIFFERENTIAL/PLATELET
Abs Immature Granulocytes: 0.04 10*3/uL (ref 0.00–0.07)
Basophils Absolute: 0.1 10*3/uL (ref 0.0–0.1)
Basophils Relative: 1 %
Eosinophils Absolute: 0.1 10*3/uL (ref 0.0–0.5)
Eosinophils Relative: 1 %
HCT: 40.4 % (ref 39.0–52.0)
Hemoglobin: 13.7 g/dL (ref 13.0–17.0)
Immature Granulocytes: 0 %
Lymphocytes Relative: 6 %
Lymphs Abs: 0.6 10*3/uL — ABNORMAL LOW (ref 0.7–4.0)
MCH: 31.1 pg (ref 26.0–34.0)
MCHC: 33.9 g/dL (ref 30.0–36.0)
MCV: 91.8 fL (ref 80.0–100.0)
Monocytes Absolute: 1 10*3/uL (ref 0.1–1.0)
Monocytes Relative: 9 %
Neutro Abs: 8.9 10*3/uL — ABNORMAL HIGH (ref 1.7–7.7)
Neutrophils Relative %: 83 %
Platelets: 423 10*3/uL — ABNORMAL HIGH (ref 150–400)
RBC: 4.4 MIL/uL (ref 4.22–5.81)
RDW: 12.7 % (ref 11.5–15.5)
WBC: 10.6 10*3/uL — ABNORMAL HIGH (ref 4.0–10.5)
nRBC: 0 % (ref 0.0–0.2)

## 2020-09-12 LAB — LIPASE, BLOOD: Lipase: 51 U/L (ref 11–51)

## 2020-09-12 MED ORDER — SODIUM CHLORIDE 0.9 % IV BOLUS
1000.0000 mL | Freq: Once | INTRAVENOUS | Status: AC
  Administered 2020-09-12: 1000 mL via INTRAVENOUS

## 2020-09-12 MED ORDER — HYDROMORPHONE HCL 1 MG/ML IJ SOLN
0.5000 mg | Freq: Once | INTRAMUSCULAR | Status: AC
  Administered 2020-09-12: 0.5 mg via INTRAVENOUS
  Filled 2020-09-12: qty 1

## 2020-09-12 MED ORDER — ONDANSETRON HCL 4 MG/2ML IJ SOLN
4.0000 mg | Freq: Once | INTRAMUSCULAR | Status: AC
  Administered 2020-09-12: 4 mg via INTRAVENOUS
  Filled 2020-09-12: qty 2

## 2020-09-12 MED ORDER — LIDOCAINE HCL 1 % IJ SOLN
INTRAMUSCULAR | Status: AC
  Filled 2020-09-12: qty 20

## 2020-09-12 NOTE — ED Triage Notes (Signed)
[facility] interpreter used for triageCostella Frye [phone]  Per Pt- My oncologist has drained my stomach three times, last drained Sunday.  Pt reports stomach is again swollen.  Reports pain "where the stomach feels too tight, there it hurts"  Denies n/v/d.  Pt reports decreased PO intake.

## 2020-09-12 NOTE — ED Notes (Signed)
Pt transported to US

## 2020-09-12 NOTE — Discharge Instructions (Addendum)
Follow-up with your appointment in the next few days

## 2020-09-12 NOTE — ED Provider Notes (Signed)
[place] DEPT Provider Note   CSN: [id number] Arrival date & time: 09/12/20  1019     History No chief complaint on file.   Jesse Frye is a 65 y.o. male.  Patient with pancreatic cancer and frequent paracenteses for ascites.  Patient complains of abdominal pain  The history is provided by the patient and medical records. No language interpreter was used.  Abdominal Pain Pain location:  Generalized Pain quality: aching   Pain radiates to:  Does not radiate Pain severity:  Moderate Onset quality:  Sudden Timing:  Constant Progression:  Worsening Chronicity:  Recurrent Context: not alcohol use   Relieved by:  Nothing Worsened by:  Nothing Ineffective treatments:  None tried Associated symptoms: no chest pain, no cough, no diarrhea, no fatigue and no hematuria        Past Medical History:  Diagnosis Date  . Abdominal pain on and off for 2 months  . Abdominal swelling   . Cancer [facility])    metastatic pancreatic cancer  . History of rectal bleeding    for 1 week  . Hyperlipidemia    no meds    Patient Active Problem List   Diagnosis Date Noted  . Goals of care, counseling/discussion 09/10/2020  . Ascites   . Malignant neoplasm of pancreas ([place])   . Generalized abdominal pain   . Intractable nausea and vomiting 09/05/2020    Past Surgical History:  Procedure Laterality Date  . broken arm     right arm  . IR IMAGING GUIDED PORT INSERTION  09/07/2020  . IR PARACENTESIS  09/07/2020  . rectal fistula         Family History  Problem Relation Age of Onset  . Esophageal cancer Father     Social History   Tobacco Use  . Smoking status: Former Smoker    Types: Cigarettes    Quit date: 08/29/2014    Years since quitting: 6.0  . Smokeless tobacco: Never Used  . Tobacco comment: Pt used to smoke 4-6 cigarettes day but has now quit as of 7 years ago  Substance Use Topics  . Alcohol use: No  . Drug use: No    Home  Medications Prior to Admission medications   Medication Sig Start Date End Date Taking? Authorizing Provider  cefdinir (OMNICEF) 300 MG capsule Take 1 capsule (300 mg total) by mouth every 12 (twelve) hours for 6 days. Patient taking differently: Take 300 mg by mouth every 12 (twelve) hours. Start date : 09/10/20 09/10/20 09/16/20 Yes Shelly Coss, MD  oxyCODONE (OXY IR/ROXICODONE) 5 MG immediate release tablet Take 1 tablet (5 mg total) by mouth every 4 (four) hours as needed for breakthrough pain. 09/10/20  Yes Shelly Coss, MD  polyethylene glycol (MIRALAX / GLYCOLAX) 17 g packet Take 17 g by mouth daily as needed. Patient taking differently: Take 17 g by mouth daily as needed for moderate constipation. 09/10/20  Yes Shelly Coss, MD  ondansetron (ZOFRAN) 4 MG tablet Take 1 tablet (4 mg total) by mouth every 6 (six) hours as needed for nausea. 09/10/20   Shelly Coss, MD  senna-docusate (SENOKOT-S) 8.6-50 MG tablet Take 1 tablet by mouth 2 (two) times daily. 09/10/20   Shelly Coss, MD    Allergies    Penicillins  Review of Systems   Review of Systems  Constitutional: Negative for appetite change and fatigue.  HENT: Negative for congestion, ear discharge and sinus pressure.   Eyes: Negative for discharge.  Respiratory:  Negative for cough.   Cardiovascular: Negative for chest pain.  Gastrointestinal: Positive for abdominal pain. Negative for diarrhea.  Genitourinary: Negative for frequency and hematuria.  Musculoskeletal: Negative for back pain.  Skin: Negative for rash.  Neurological: Negative for seizures and headaches.  Psychiatric/Behavioral: Negative for hallucinations.    Physical Exam Updated Vital Signs BP 128/87   Pulse 90   Temp 98.2 F (36.8 C) (Oral)   Resp 13   Ht 5\' 6"  (1.676 m)   Wt 72.2 kg   SpO2 98%   BMI 25.68 kg/m   Physical Exam Vitals and nursing note reviewed.  Constitutional:      Appearance: He is well-developed.  HENT:     Head:  Normocephalic.     Nose: Nose normal.  Eyes:     General: No scleral icterus.    Extraocular Movements: EOM normal.     Conjunctiva/sclera: Conjunctivae normal.  Neck:     Thyroid: No thyromegaly.  Cardiovascular:     Rate and Rhythm: Normal rate and regular rhythm.     Heart sounds: No murmur heard. No friction rub. No gallop.   Pulmonary:     Breath sounds: No stridor. No wheezing or rales.  Chest:     Chest wall: No tenderness.  Abdominal:     General: There is no distension.     Tenderness: There is abdominal tenderness. There is no rebound.  Musculoskeletal:        General: No edema. Normal range of motion.     Cervical back: Neck supple.  Lymphadenopathy:     Cervical: No cervical adenopathy.  Skin:    Findings: No erythema or rash.  Neurological:     Mental Status: He is alert and oriented to person, place, and time.     Motor: No abnormal muscle tone.     Coordination: Coordination normal.  Psychiatric:        Mood and Affect: Mood and affect normal.        Behavior: Behavior normal.     ED Results / Procedures / Treatments   Labs (all labs ordered are listed, but only abnormal results are displayed) Labs Reviewed  CBC WITH DIFFERENTIAL/PLATELET - Abnormal; Notable for the following components:      Result Value   WBC 10.6 (*)    Platelets 423 (*)    Neutro Abs 8.9 (*)    Lymphs Abs 0.6 (*)    All other components within normal limits  COMPREHENSIVE METABOLIC PANEL - Abnormal; Notable for the following components:   Sodium 128 (*)    Chloride 95 (*)    CO2 21 (*)    Glucose, Bld 155 (*)    BUN 30 (*)    Calcium 8.3 (*)    Total Protein 6.3 (*)    Albumin 2.6 (*)    All other components within normal limits  LIPASE, BLOOD    EKG None  Radiology DG ABD ACUTE 2+V W 1V CHEST  Result Date: 09/12/2020 CLINICAL DATA:  Abdominal pain, history of ascites and paracentesis, pancreatic neoplasm EXAM: DG ABDOMEN ACUTE WITH 1 VIEW CHEST COMPARISON:   09/05/2020 FINDINGS: RIGHT subclavian Port-A-Cath with tip projecting over cavoatrial junction, new. Normal heart size, mediastinal contours, and pulmonary vascularity. Atherosclerotic calcification aorta. Decreased basilar atelectasis and accentuation of perihilar markings. No acute infiltrate, pleural effusion or pneumothorax. Nonobstructive bowel gas pattern. No bowel dilatation, bowel wall thickening, or free air. Opacity in pelvis and flanks likely reflects mild ascites. Osseous structures unremarkable.  IMPRESSION: Decreased bibasilar atelectasis. Nonobstructive bowel gas pattern. Probable ascites. Electronically Signed   By: Lavonia Dana M.D.   On: 09/12/2020 11:23    Procedures Procedures (including critical care time)  Medications Ordered in ED Medications  lidocaine (XYLOCAINE) 1 % (with pres) injection (has no administration in time range)  HYDROmorphone (DILAUDID) injection 0.5 mg (0.5 mg Intravenous Given 09/12/20 1130)  ondansetron (ZOFRAN) injection 4 mg (4 mg Intravenous Given 09/12/20 1129)  sodium chloride 0.9 % bolus 1,000 mL (0 mLs Intravenous Stopped 09/12/20 1310)    ED Course  I have reviewed the triage vital signs and the nursing notes.  Pertinent labs & imaging results that were available during my care of the patient were reviewed by me and considered in my medical decision making (see chart for details).   I spoke with his oncologist Dr. Eldridge Scot and he suggested doing a paracentesis.  He will then follow-up with the patient next few days MDM Rules/Calculators/A&P                          Patient had a paracentesis for ascites.  He will follow-up with oncology in a few days as scheduled. Patient tolerated the paracentesis well Final Clinical Impression(s) / ED Diagnoses Final diagnoses:  Pain    Rx / DC Orders ED Discharge Orders    None       Milton Ferguson, MD 09/15/20 [phone]

## 2020-09-12 NOTE — Telephone Encounter (Signed)
Called Central Scheduling to schedule paracentesis. Spoke to [employer]. First available is Friday 1/14 at 11:00 am with arrival at 10:45. Dr. Alen Blew made aware. Spoke to patient's daughter Stanton Kidney and made her aware of appointment day and time. Per Stanton Kidney, she is not sure if her dad can wait 2 days to have paracentesis done. Informed Stanton Kidney that Friday is the first available and Dr. Alen Blew was made aware. Mary verbalized understanding but also stated "if my dad gets worse I am taking him to the ED."

## 2020-09-12 NOTE — Procedures (Signed)
Ultrasound-guided  therapeutic paracentesis performed yielding 3.2 liters of turbid, light bloody fluid. No immediate complications. EBL < 1 cc.

## 2020-09-12 NOTE — Telephone Encounter (Signed)
-----   Message from Wyatt Portela, MD sent at 09/12/2020  8:13 AM EST ----- Regarding: Paracentesis Please call IR to schedule paracentesis ASAP.  Orders are in.  Thanks

## 2020-09-13 ENCOUNTER — Inpatient Hospital Stay: Payer: Self-pay

## 2020-09-13 ENCOUNTER — Encounter (HOSPITAL_COMMUNITY): Admission: RE | Payer: Self-pay | Source: Home / Self Care

## 2020-09-13 ENCOUNTER — Ambulatory Visit (HOSPITAL_COMMUNITY): Admission: RE | Admit: 2020-09-13 | Payer: Self-pay | Source: Home / Self Care | Admitting: Gastroenterology

## 2020-09-13 SURGERY — UPPER ENDOSCOPIC ULTRASOUND (EUS) RADIAL
Anesthesia: Monitor Anesthesia Care

## 2020-09-14 ENCOUNTER — Other Ambulatory Visit: Payer: Self-pay

## 2020-09-14 ENCOUNTER — Inpatient Hospital Stay: Payer: Self-pay | Attending: Oncology

## 2020-09-14 ENCOUNTER — Ambulatory Visit (HOSPITAL_COMMUNITY)
Admission: RE | Admit: 2020-09-14 | Discharge: 2020-09-14 | Disposition: A | Discharge status: Home / Self Care | Payer: Self-pay | Source: Ambulatory Visit | Attending: Oncology | Admitting: Oncology

## 2020-09-14 ENCOUNTER — Other Ambulatory Visit: Payer: Self-pay | Admitting: Oncology

## 2020-09-14 DIAGNOSIS — R180 Malignant ascites: Secondary | ICD-10-CM | POA: Insufficient documentation

## 2020-09-14 LAB — CULTURE, BODY FLUID W GRAM STAIN -BOTTLE: Culture: NO GROWTH

## 2020-09-14 MED ORDER — LIDOCAINE-PRILOCAINE 2.5-2.5 % EX CREA: 1.0000 "application " | TOPICAL_CREAM | CUTANEOUS | 0 refills | Status: DC | PRN

## 2020-09-14 MED ORDER — LIDOCAINE HCL 1 % IJ SOLN
INTRAMUSCULAR | Status: AC
  Filled 2020-09-14: qty 10

## 2020-09-14 MED ORDER — ONDANSETRON HCL 4 MG PO TABS: 4.0000 mg | ORAL_TABLET | Freq: Four times a day (QID) | ORAL | 0 refills | Status: DC | PRN

## 2020-09-14 NOTE — Procedures (Signed)
Ultrasound-guided therapeutic paracentesis performed yielding 2.9 liters of bloody fluid. No immediate complications.EBL< 1 cc.

## 2020-09-17 ENCOUNTER — Other Ambulatory Visit: Payer: Self-pay

## 2020-09-17 ENCOUNTER — Encounter (HOSPITAL_COMMUNITY): Payer: Self-pay

## 2020-09-17 ENCOUNTER — Telehealth: Payer: Self-pay | Admitting: Oncology

## 2020-09-17 ENCOUNTER — Inpatient Hospital Stay: Payer: Self-pay | Admitting: Oncology

## 2020-09-17 DIAGNOSIS — C259 Malignant neoplasm of pancreas, unspecified: Principal | ICD-10-CM | POA: Insufficient documentation

## 2020-09-17 DIAGNOSIS — R188 Other ascites: Secondary | ICD-10-CM | POA: Insufficient documentation

## 2020-09-17 DIAGNOSIS — Z20822 Contact with and (suspected) exposure to covid-19: Secondary | ICD-10-CM | POA: Insufficient documentation

## 2020-09-17 DIAGNOSIS — Z87891 Personal history of nicotine dependence: Secondary | ICD-10-CM | POA: Insufficient documentation

## 2020-09-17 DIAGNOSIS — E871 Hypo-osmolality and hyponatremia: Secondary | ICD-10-CM | POA: Insufficient documentation

## 2020-09-17 DIAGNOSIS — E875 Hyperkalemia: Secondary | ICD-10-CM | POA: Insufficient documentation

## 2020-09-17 LAB — COMPREHENSIVE METABOLIC PANEL
ALT: 19 U/L (ref 0–44)
AST: 24 U/L (ref 15–41)
Albumin: 2.5 g/dL — ABNORMAL LOW (ref 3.5–5.0)
Alkaline Phosphatase: 55 U/L (ref 38–126)
Anion gap: 11 (ref 5–15)
BUN: 36 mg/dL — ABNORMAL HIGH (ref 8–23)
CO2: 22 mmol/L (ref 22–32)
Calcium: 8.4 mg/dL — ABNORMAL LOW (ref 8.9–10.3)
Chloride: 91 mmol/L — ABNORMAL LOW (ref 98–111)
Creatinine, Ser: 1.16 mg/dL (ref 0.61–1.24)
GFR, Estimated: >60 mL/min (ref >60)
Glucose, Bld: 156 mg/dL — ABNORMAL HIGH (ref 70–99)
Potassium: 6.1 mmol/L — ABNORMAL HIGH (ref 3.5–5.1)
Sodium: 124 mmol/L — ABNORMAL LOW (ref 135–145)
Total Bilirubin: 0.7 mg/dL (ref 0.3–1.2)
Total Protein: 6.4 g/dL — ABNORMAL LOW (ref 6.5–8.1)

## 2020-09-17 LAB — CBC
HCT: 37.1 % — ABNORMAL LOW (ref 39.0–52.0)
Hemoglobin: 12.5 g/dL — ABNORMAL LOW (ref 13.0–17.0)
MCH: 30.9 pg (ref 26.0–34.0)
MCHC: 33.7 g/dL (ref 30.0–36.0)
MCV: 91.6 fL (ref 80.0–100.0)
Platelets: 490 10*3/uL — ABNORMAL HIGH (ref 150–400)
RBC: 4.05 MIL/uL — ABNORMAL LOW (ref 4.22–5.81)
RDW: 13 % (ref 11.5–15.5)
WBC: 11.1 10*3/uL — ABNORMAL HIGH (ref 4.0–10.5)
nRBC: 0 % (ref 0.0–0.2)

## 2020-09-17 LAB — LIPASE, BLOOD: Lipase: 47 U/L (ref 11–51)

## 2020-09-17 NOTE — Telephone Encounter (Signed)
Cancelled 01/18 appointments due to weather conditions, waiting on confirmation from provider and charge nurse on where to reschedule appointments.

## 2020-09-17 NOTE — ED Notes (Signed)
Pt unable to provide a urine specimen at this time. He was given a specimen cup.

## 2020-09-17 NOTE — ED Triage Notes (Signed)
Pt reports RUQ abdominal pain and swelling that got worse today. Pt sts having abdomen drained several times. Pancreatic cancer.

## 2020-09-18 ENCOUNTER — Telehealth: Payer: Self-pay

## 2020-09-18 ENCOUNTER — Encounter (HOSPITAL_COMMUNITY): Payer: Self-pay

## 2020-09-18 ENCOUNTER — Inpatient Hospital Stay: Payer: Self-pay

## 2020-09-18 ENCOUNTER — Encounter: Payer: Self-pay | Admitting: Oncology

## 2020-09-18 ENCOUNTER — Emergency Department (HOSPITAL_COMMUNITY): Payer: Self-pay

## 2020-09-18 ENCOUNTER — Inpatient Hospital Stay: Payer: Self-pay | Admitting: Oncology

## 2020-09-18 ENCOUNTER — Observation Stay (HOSPITAL_COMMUNITY)
Admission: EM | Admit: 2020-09-18 | Discharge: 2020-09-19 | Disposition: A | Discharge status: Home / Self Care | Payer: Self-pay | Attending: Family Medicine | Admitting: Family Medicine

## 2020-09-18 DIAGNOSIS — R180 Malignant ascites: Secondary | ICD-10-CM

## 2020-09-18 DIAGNOSIS — C259 Malignant neoplasm of pancreas, unspecified: Secondary | ICD-10-CM | POA: Diagnosis present

## 2020-09-18 DIAGNOSIS — E875 Hyperkalemia: Secondary | ICD-10-CM | POA: Diagnosis present

## 2020-09-18 DIAGNOSIS — R112 Nausea with vomiting, unspecified: Secondary | ICD-10-CM

## 2020-09-18 DIAGNOSIS — R188 Other ascites: Secondary | ICD-10-CM | POA: Diagnosis present

## 2020-09-18 DIAGNOSIS — R1084 Generalized abdominal pain: Secondary | ICD-10-CM

## 2020-09-18 DIAGNOSIS — Z515 Encounter for palliative care: Secondary | ICD-10-CM

## 2020-09-18 DIAGNOSIS — C251 Malignant neoplasm of body of pancreas: Secondary | ICD-10-CM

## 2020-09-18 DIAGNOSIS — E871 Hypo-osmolality and hyponatremia: Secondary | ICD-10-CM | POA: Diagnosis present

## 2020-09-18 LAB — URINALYSIS, ROUTINE W REFLEX MICROSCOPIC
Bilirubin Urine: NEGATIVE
Glucose, UA: NEGATIVE mg/dL
Hgb urine dipstick: NEGATIVE
Ketones, ur: 5 mg/dL — AB
Leukocytes,Ua: NEGATIVE
Nitrite: NEGATIVE
Protein, ur: NEGATIVE mg/dL
Specific Gravity, Urine: >1.046 — ABNORMAL HIGH (ref 1.005–1.030)
pH: 5 (ref 5.0–8.0)

## 2020-09-18 LAB — GLUCOSE, CAPILLARY: Glucose-Capillary: 139 mg/dL — ABNORMAL HIGH (ref 70–99)

## 2020-09-18 LAB — RESP PANEL BY RT-PCR (FLU A&B, COVID) ARPGX2
Influenza A by PCR: NEGATIVE
Influenza B by PCR: NEGATIVE
SARS Coronavirus 2 by RT PCR: NEGATIVE

## 2020-09-18 MED ORDER — OSMOLITE 1.2 CAL PO LIQD
1000.0000 mL | ORAL | Status: DC
  Filled 2020-09-18 (×2): qty 1000

## 2020-09-18 MED ORDER — MORPHINE SULFATE (PF) 4 MG/ML IV SOLN: 4.0000 mg | INTRAVENOUS | Status: DC | PRN

## 2020-09-18 MED ORDER — ENOXAPARIN SODIUM 40 MG/0.4ML ~~LOC~~ SOLN
40.0000 mg | Freq: Every day | SUBCUTANEOUS | Status: DC
  Administered 2020-09-18: 40 mg via SUBCUTANEOUS
  Filled 2020-09-18: qty 0.4

## 2020-09-18 MED ORDER — POLYETHYLENE GLYCOL 3350 17 G PO PACK: 17.0000 g | PACK | Freq: Every day | ORAL | Status: DC | PRN

## 2020-09-18 MED ORDER — FENTANYL CITRATE (PF) 100 MCG/2ML IJ SOLN
50.0000 ug | Freq: Once | INTRAMUSCULAR | Status: AC
  Administered 2020-09-18: 50 ug via INTRAVENOUS
  Filled 2020-09-18: qty 2

## 2020-09-18 MED ORDER — LIDOCAINE-PRILOCAINE 2.5-2.5 % EX CREA
1.0000 "application " | TOPICAL_CREAM | CUTANEOUS | Status: DC | PRN
  Filled 2020-09-18: qty 5

## 2020-09-18 MED ORDER — ONDANSETRON HCL 4 MG/2ML IJ SOLN
4.0000 mg | Freq: Four times a day (QID) | INTRAMUSCULAR | Status: DC | PRN
  Administered 2020-09-18 – 2020-09-19 (×2): 4 mg via INTRAVENOUS
  Filled 2020-09-18 (×2): qty 2

## 2020-09-18 MED ORDER — PROMETHAZINE HCL 25 MG/ML IJ SOLN
12.5000 mg | Freq: Once | INTRAMUSCULAR | Status: AC
  Administered 2020-09-18: 12.5 mg via INTRAVENOUS
  Filled 2020-09-18: qty 1

## 2020-09-18 MED ORDER — SENNOSIDES-DOCUSATE SODIUM 8.6-50 MG PO TABS
1.0000 | ORAL_TABLET | Freq: Two times a day (BID) | ORAL | Status: DC
  Administered 2020-09-18: 1 via ORAL

## 2020-09-18 MED ORDER — MORPHINE SULFATE (PF) 4 MG/ML IV SOLN
4.0000 mg | INTRAVENOUS | Status: DC
  Administered 2020-09-18 – 2020-09-19 (×3): 4 mg via INTRAVENOUS
  Filled 2020-09-18 (×5): qty 1

## 2020-09-18 MED ORDER — SODIUM CHLORIDE 0.9 % IV BOLUS
500.0000 mL | Freq: Once | INTRAVENOUS | Status: AC
  Administered 2020-09-18: 500 mL via INTRAVENOUS

## 2020-09-18 MED ORDER — LIDOCAINE HCL 1 % IJ SOLN
INTRAMUSCULAR | Status: AC
  Filled 2020-09-18: qty 20

## 2020-09-18 MED ORDER — ONDANSETRON HCL 4 MG PO TABS: 4.0000 mg | ORAL_TABLET | Freq: Four times a day (QID) | ORAL | Status: DC | PRN

## 2020-09-18 MED ORDER — PANTOPRAZOLE SODIUM 40 MG IV SOLR
40.0000 mg | INTRAVENOUS | Status: DC
  Administered 2020-09-18 – 2020-09-19 (×2): 40 mg via INTRAVENOUS
  Filled 2020-09-18 (×2): qty 40

## 2020-09-18 MED ORDER — PROMETHAZINE HCL 25 MG/ML IJ SOLN
12.5000 mg | Freq: Four times a day (QID) | INTRAMUSCULAR | Status: DC | PRN
  Administered 2020-09-18: 12.5 mg via INTRAVENOUS
  Filled 2020-09-18: qty 1

## 2020-09-18 MED ORDER — IOHEXOL 300 MG/ML  SOLN
100.0000 mL | Freq: Once | INTRAMUSCULAR | Status: AC | PRN
  Administered 2020-09-18: 100 mL via INTRAVENOUS

## 2020-09-18 MED ORDER — SUCRALFATE 1 GM/10ML PO SUSP
1.0000 g | Freq: Three times a day (TID) | ORAL | Status: DC
  Administered 2020-09-18: 1 g via ORAL
  Filled 2020-09-18: qty 10

## 2020-09-18 MED ORDER — SODIUM CHLORIDE 0.9 % IV SOLN: Freq: Once | INTRAVENOUS | Status: AC

## 2020-09-18 MED ORDER — LIDOCAINE HCL 1 % IJ SOLN
INTRAMUSCULAR | Status: AC
  Filled 2020-09-18: qty 10

## 2020-09-18 MED ORDER — SODIUM CHLORIDE 0.9 % IV SOLN: INTRAVENOUS | Status: AC

## 2020-09-18 NOTE — Telephone Encounter (Signed)
-----   Message from Wyatt Portela, MD sent at 09/18/2020 11:37 AM EST ----- Regarding: RE: Family concern. Request for follow-up I will call her ASAP. Thanks ----- Message ----- From: Teodoro Spray, RN Sent: 09/18/2020  11:05 AM EST To: Wyatt Portela, MD Subject: Family concern. Request for follow-up          Patient is currently in ED for ascites, abdominal pain.  Patient's daughter, Stanton Kidney is concerned that patient was not able to start chemo this morning (he was rescheduled due to weather) and that patient has not "eaten for days and days".  Patient's daughter is concerned and wants to talk to you about patient's prognosis and treatment plan. Patient's daughter Stanton Kidney) is point of contact for patient.

## 2020-09-18 NOTE — Telephone Encounter (Signed)
See message below °

## 2020-09-18 NOTE — ED Notes (Signed)
Alondra Sahni, daughter, [phone] would like an update.

## 2020-09-18 NOTE — H&P (Addendum)
History and Physical    Jesse Frye OEV:[id number] DOB: Dec 03, 1955 DOA: 09/18/2020  PCP: Patient, No Pcp Per   Patient coming from: home  I have personally briefly reviewed patient's old medical records in [place]  Chief Complaint: increasing pain, abdominal swelling, anorexia  HPI: Jesse Frye is a 65 y.o. male with medical history significant of pancreatic cancer, diagnosed 08/2020, presents with abdominal distention and pain c/w recurrent ascites requiring paracentesis. Last procedure 09/14/20 with procedure just 2 days before that. No fever. He has small volume emesis over the last 24 hours. He is no longer able to eat anything. Family brings him in for further management as his home medications as not controlling nausea or pain, and for recurrent ascites   ED Course: T 98.2  113/80  HR 93  RR 18. ED-PA exam notable for ascites, abdominal tenderness. Lab: Na 124, K 6.1, Covid NEGATIVE, CBC nl. EKG with peaked T in V2. CT with rapidly progressive pancreatic cancer, new liver mets, caking of the omentum and ascites. Paracentesis ordered in ED. Dr. Alen Blew contacted who has requested [facility] to admit for correction of metabolic derangement.   Review of Systems: As per HPI otherwise 10 point review of systems negative, except for burning in his throat secondary to reflux and emesis.  Past Medical History:  Diagnosis Date  . Abdominal pain on and off for 2 months  . Abdominal swelling   . Cancer [facility])    metastatic pancreatic cancer  . History of rectal bleeding    for 1 week  . Hyperlipidemia    no meds    Past Surgical History:  Procedure Laterality Date  . broken arm     right arm  . IR IMAGING GUIDED PORT INSERTION  09/07/2020  . IR PARACENTESIS  09/07/2020  . rectal fistula      Soc Hx - married. 1 son, 2 daughters, 1 grandchild. Lives with his wife. Worked as a Transport planner. Very supportive family. Practicing [employer].   reports that he quit smoking  about 6 years ago. His smoking use included cigarettes. He has never used smokeless tobacco. He reports that he does not drink alcohol and does not use drugs.  Allergies  Allergen Reactions  . Penicillins Shortness Of Breath and Other (See Comments)    Childhood reaction Other reaction(s): Dizziness Tolerated amoxicillin in 2016 and Augmentin in 2018    Family History  Problem Relation Age of Onset  . Esophageal cancer Father      Prior to Admission medications   Medication Sig Start Date End Date Taking? Authorizing Provider  lidocaine-prilocaine (EMLA) cream Apply 1 application topically as needed. 09/14/20   Wyatt Portela, MD  ondansetron (ZOFRAN) 4 MG tablet Take 1 tablet (4 mg total) by mouth every 6 (six) hours as needed for nausea. 09/14/20   Wyatt Portela, MD  oxyCODONE (OXY IR/ROXICODONE) 5 MG immediate release tablet Take 1 tablet (5 mg total) by mouth every 4 (four) hours as needed for breakthrough pain. 09/10/20   Shelly Coss, MD  polyethylene glycol (MIRALAX / GLYCOLAX) 17 g packet Take 17 g by mouth daily as needed. Patient taking differently: Take 17 g by mouth daily as needed for moderate constipation. 09/10/20   Shelly Coss, MD  senna-docusate (SENOKOT-S) 8.6-50 MG tablet Take 1 tablet by mouth 2 (two) times daily. 09/10/20   Shelly Coss, MD    Physical Exam: Vitals:   09/18/20 0935 09/18/20 1001 09/18/20 1004 09/18/20 1013  BP: 113/80 117/79 116/76 118/78  Pulse:      Resp:      Temp:      TempSrc:      SpO2:         Vitals:   09/18/20 0935 09/18/20 1001 09/18/20 1004 09/18/20 1013  BP: 113/80 117/79 116/76 118/78  Pulse:      Resp:      Temp:      TempSrc:      SpO2:       General: emaciated man who is uncomfortable, marked temporal wasting noted Eyes: PERRL, lids and conjunctivae normal-w/o icterus ENMT: Mucous membranes are dry.Normal dentition.  Neck: normal, supple, no masses, no thyromegaly Respiratory: clear to auscultation  bilaterally, no wheezing, no crackles. Normal respiratory effort. No accessory muscle use.  Cardiovascular: Regular rate and rhythm, no murmurs / rubs / gallops. No extremity edema. 2+ pedal pulses. No carotid bruits.  Abdomen: Very tender, worst at epigastrum and RLQ. Bowel sounds positive.  Musculoskeletal: no clubbing / cyanosis. No joint deformity upper and lower extremities. Good ROM, no contractures. Normal muscle tone.  Skin: no rashes, lesions, ulcers. No induration Neurologic: CN 2-12 grossly intact.Strength 4/5 in all 4.  Psychiatric: Normal judgment and insight. Alert and oriented x 3. Normal mood.     Labs on Admission: I have personally reviewed following labs and imaging studies  CBC: Recent Labs  Lab 09/12/20 1131 09/17/20 2307  WBC 10.6* 11.1*  NEUTROABS 8.9*  --   HGB 13.7 12.5*  HCT 40.4 37.1*  MCV 91.8 91.6  PLT 423* 123XX123*   Basic Metabolic Panel: Recent Labs  Lab 09/12/20 1131 09/17/20 2307  NA 128* 124*  K 4.7 6.1*  CL 95* 91*  CO2 21* 22  GLUCOSE 155* 156*  BUN 30* 36*  CREATININE 0.93 1.16  CALCIUM 8.3* 8.4*   GFR: Estimated Creatinine Clearance: 59.6 mL/min (by C-G formula based on SCr of 1.16 mg/dL). Liver Function Tests: Recent Labs  Lab 09/12/20 1131 09/17/20 2307  AST 24 24  ALT 19 19  ALKPHOS 50 55  BILITOT 0.5 0.7  PROT 6.3* 6.4*  ALBUMIN 2.6* 2.5*   Recent Labs  Lab 09/12/20 1131 09/17/20 2307  LIPASE 51 47   No results for input(s): AMMONIA in the last 168 hours. Coagulation Profile: No results for input(s): INR, PROTIME in the last 168 hours. Cardiac Enzymes: No results for input(s): CKTOTAL, CKMB, CKMBINDEX, TROPONINI in the last 168 hours. BNP (last 3 results) No results for input(s): PROBNP in the last 8760 hours. HbA1C: No results for input(s): HGBA1C in the last 72 hours. CBG: No results for input(s): GLUCAP in the last 168 hours. Lipid Profile: No results for input(s): CHOL, HDL, LDLCALC, TRIG, CHOLHDL,  LDLDIRECT in the last 72 hours. Thyroid Function Tests: No results for input(s): TSH, T4TOTAL, FREET4, T3FREE, THYROIDAB in the last 72 hours. Anemia Panel: No results for input(s): VITAMINB12, FOLATE, FERRITIN, TIBC, IRON, RETICCTPCT in the last 72 hours. Urine analysis:    Component Value Date/Time   COLORURINE YELLOW 08/21/2020 [place] 08/21/2020 0651   LABSPEC 1.013 08/21/2020 0651   PHURINE 5.0 08/21/2020 0651   GLUCOSEU NEGATIVE 08/21/2020 0651   HGBUR NEGATIVE 08/21/2020 0651   BILIRUBINUR NEGATIVE 08/21/2020 0651   KETONESUR NEGATIVE 08/21/2020 0651   PROTEINUR NEGATIVE 08/21/2020 0651   UROBILINOGEN 0.2 02/12/2015 1224   NITRITE NEGATIVE 08/21/2020 0651   LEUKOCYTESUR TRACE (A) 08/21/2020 0651    Radiological Exams on Admission: CT ABDOMEN  PELVIS W CONTRAST  Result Date: 09/18/2020 CLINICAL DATA:  Right upper quadrant abdominal pain and abdominal distension, metastatic pancreatic carcinoma. Rectal bleeding. Prior rectal fistula repair. Multiple recent paracentesis, most recently 4 days ago. EXAM: CT ABDOMEN AND PELVIS WITH CONTRAST TECHNIQUE: Multidetector CT imaging of the abdomen and pelvis was performed using the standard protocol following bolus administration of intravenous contrast. CONTRAST:  160mL OMNIPAQUE IOHEXOL 300 MG/ML  SOLN COMPARISON:  08/21/2020 FINDINGS: Lower chest: Linear subsegmental atelectasis in the lingula. Faint mosaic attenuation in the bases of the lower lobes, possibly from air trapping. A central catheter tip terminates at the lower cavoatrial junction. A lower paraesophageal lymph node measures 1.5 cm in short axis on image 5 of series 2, previously 1.6 cm. There is a suggestion of a small hiatal hernia potentially with tumor from the peripancreatic region extending up through the hiatus. Hepatobiliary: Newly appreciable 1.4 by 1.6 cm hypodense lesion posteriorly in the right hepatic lobe on image 17 of series 2, concerning for  metastatic disease. Gallbladder unremarkable. The previous small foci of right portal vein thrombus shown on 08/21/2020 have resolved. Pancreas: Rapidly enlarging pancreatic mass extending along the lesser curvature of the stomach and also extending posteriorly adjacent to the duodenum. The anterior component measures approximately 6.6 by 8.0 by 6.7 cm and is of higher density, as measured on image 20 of series 2. The component in the pancreatic head and body measures 6.5 by 4.2 by 5.6 cm on image 28 of series 2, and formerly measured about 4.8 by 2.7 by 4.5 cm. A third area of tumor along the posterior margin of the pancreas in fell of the SMA and measures 5.0 by 3.9 by 3.5 cm. All these elements of substantially enlarged compared to 08/21/2020. Spleen: Unremarkable Adrenals/Urinary Tract: Unremarkable Stomach/Bowel: The anterior component of tumor along the lesser sac is intimately associated with lesser curvature of the stomach and invasion of the gastric wall is not readily excluded. The duodenum is displaced by tumor. Scattered diverticula of the descending colon. Vascular/Lymphatic: Aortoiliac atherosclerotic vascular disease. 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 There some collateral vessels around the spleen and stomach. Indistinctly marginated porta hepatis adenopathy substantially worsened from 08/21/2014. Portacaval node 2.3 cm in short axis on image 27 of series 2. Reproductive: Unremarkable Other: Extensive and rapid progression of tumor caking in the omentum and along peritoneal surfaces especially in the pelvis where the tumor rind is particularly thick informs masslike components measuring 4.5 cm in thickness on the left and 3.0 cm in thickness on the right on image 69 of series 2. Malignant ascites noted, moderate in amount. Fluid infiltration of the mesentery diffusely. Musculoskeletal: The known subacute  fracture the right anterior seventh rib is not included on today's images. IMPRESSION: 1. Extensive and rapid progression of tumor caking in the omentum and along peritoneal surfaces especially in the pelvis, with moderate malignant ascites. 2. Rapidly enlarging pancreatic and peripancreatic masses. There is also a newly appreciable 1.4 by 1.6 cm hypodense lesion posteriorly in the right hepatic lobe concerning for metastatic disease. 3. The previous small foci of right portal vein thrombus shown on 08/21/2020 have resolved. 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 4. There is a suggestion of a small hiatal hernia potentially with tumor extending up through the hiatus. Adjacent lower paraesophageal adenopathy. 5. Faint mosaic attenuation in the bases of  the lower lobes, possibly from air trapping. 6. Aortic atherosclerosis. Aortic Atherosclerosis (ICD10-I70.0). Electronically Signed   By: Van Clines M.D.   On: 09/18/2020 08:32    EKG: Independently reviewed. Sinus Tachycardia, peaked T in V2  Assessment/Plan Active Problems:   Ascites   Malignant neoplasm of pancreas ([facility])   Generalized abdominal pain   Hyponatremia   Hyperkalemia    1. Hyponatremia - most likely secondary to PO po intake and fluid losses with N/V and paracentesis. Plan Replacement with NS at 100 cc/hr  2. Hyperkalemia - most likely secondary to dehydration and fluid loss via Paracentesis Plan Hydration with NS  3. Pancreatic cancer with ascites and progressive pain. CT reveals rapidly progressive tumor growth, new liver lesion, omental caking. Plan Pain mgt - has failed tramadol and percocet. Will start MS 4 mg q4, hold for sedation  IV PPI and PO carafate suspension for esophageal discomfort  Palliative care consult request entered - long term opioids will be needed  Consult with IR for placement of intra-abdominal catheter to  facilitate management of ascites.  Hospice referral  Dr. Alen Blew will follow  4. Code status - initiated discussion with patient with his daughter, who translates for him, present. Explained very poor prognosis and futility of resuscitation in the face of aggressive pancreatic cancer. HE will think about this and discuss with his Idelle Crouch.   Addendum: [facility] Care meeting with patient and his family: wife, son, two daughters. Reviewed disease progression and poor prognosis. Decision made to change status to DNR, place NG feeding tube for some nutritional support, comfort care. Willing to consider hospice care, preferably at home. Orders written.  DVT prophylaxis: lovenox  Code Status: full code  Family Communication: Daughter was present for interview and exam. She translated for him. Discussed Dx, Prognosis, Tx plan, need for Hospice referral.  Disposition Plan: HOme when stable. TOC consult requested  Consults called: Oncology - Dr. Alen Blew, Palliative Care - consult request placed, IR Admission status: observation    Adella Hare MD [facility] Pager [phone]  If 7PM-7AM, please contact night-coverage [email or web address] Password [facility]  09/18/2020, 10:16 AM

## 2020-09-18 NOTE — ED Provider Notes (Signed)
[facility] DEPT Provider Note   CSN: [id number] Arrival date & time: 09/17/20  2242     History Chief Complaint  Patient presents with  . Abdominal Pain    Jesse Frye is a 65 y.o. male.  Patient with history of pancreatic cancer, diagnosed 08/2020, presents with abdominal distention and pain c/w recurrent ascites requiring paracentesis. Last procedure 09/14/20 with procedure just 2 days before that. No fever. He has has TNTC, small volume emesis over the last 24 hours. He is no longer able to eat anything. Family brings him in for further management as his home medications as not controlling nausea or pain, and for recurrent ascites.   The history is provided by the patient and a relative. No language interpreter was used.  Abdominal Pain Associated symptoms: nausea, shortness of breath (Deep breathing causes abdominal pain) and vomiting   Associated symptoms: no chills, no diarrhea and no fever        Past Medical History:  Diagnosis Date  . Abdominal pain on and off for 2 months  . Abdominal swelling   . Cancer [facility])    metastatic pancreatic cancer  . History of rectal bleeding    for 1 week  . Hyperlipidemia    no meds    Patient Active Problem List   Diagnosis Date Noted  . Goals of care, counseling/discussion 09/10/2020  . Ascites   . Malignant neoplasm of pancreas ([place])   . Generalized abdominal pain   . Intractable nausea and vomiting 09/05/2020    Past Surgical History:  Procedure Laterality Date  . broken arm     right arm  . IR IMAGING GUIDED PORT INSERTION  09/07/2020  . IR PARACENTESIS  09/07/2020  . rectal fistula         Family History  Problem Relation Age of Onset  . Esophageal cancer Father     Social History   Tobacco Use  . Smoking status: Former Smoker    Types: Cigarettes    Quit date: 08/29/2014    Years since quitting: 6.0  . Smokeless tobacco: Never Used  . Tobacco comment: Pt used to  smoke 4-6 cigarettes day but has now quit as of 7 years ago  Substance Use Topics  . Alcohol use: No  . Drug use: No    Home Medications Prior to Admission medications   Medication Sig Start Date End Date Taking? Authorizing Provider  lidocaine-prilocaine (EMLA) cream Apply 1 application topically as needed. 09/14/20   Wyatt Portela, MD  ondansetron (ZOFRAN) 4 MG tablet Take 1 tablet (4 mg total) by mouth every 6 (six) hours as needed for nausea. 09/14/20   Wyatt Portela, MD  oxyCODONE (OXY IR/ROXICODONE) 5 MG immediate release tablet Take 1 tablet (5 mg total) by mouth every 4 (four) hours as needed for breakthrough pain. 09/10/20   Shelly Coss, MD  polyethylene glycol (MIRALAX / GLYCOLAX) 17 g packet Take 17 g by mouth daily as needed. Patient taking differently: Take 17 g by mouth daily as needed for moderate constipation. 09/10/20   Shelly Coss, MD  senna-docusate (SENOKOT-S) 8.6-50 MG tablet Take 1 tablet by mouth 2 (two) times daily. 09/10/20   Shelly Coss, MD    Allergies    Penicillins  Review of Systems   Review of Systems  Constitutional: Positive for appetite change. Negative for chills and fever.  HENT: Negative.   Respiratory: Positive for shortness of breath (Deep breathing causes abdominal pain).  Cardiovascular: Negative.   Gastrointestinal: Positive for abdominal pain, nausea and vomiting. Negative for diarrhea.  Musculoskeletal: Negative.   Skin: Negative.   Neurological: Positive for weakness.    Physical Exam Updated Vital Signs BP 115/80 (BP Location: Left Arm)   Pulse 98   Temp 98.2 F (36.8 C) (Oral)   Resp 19   SpO2 96%   Physical Exam Vitals and nursing note reviewed.  Constitutional:      Appearance: He is well-developed and well-nourished. He is ill-appearing.  HENT:     Head: Normocephalic.  Cardiovascular:     Rate and Rhythm: Normal rate and regular rhythm.     Heart sounds: No murmur heard.   Pulmonary:     Effort:  Pulmonary effort is normal.     Breath sounds: Normal breath sounds. No wheezing, rhonchi or rales.  Abdominal:     General: Bowel sounds are normal. There is distension.     Palpations: Abdomen is soft. There is fluid wave.     Tenderness: There is generalized abdominal tenderness. There is no guarding or rebound.  Musculoskeletal:        General: Normal range of motion.     Cervical back: Normal range of motion and neck supple.  Skin:    General: Skin is warm and dry.     Findings: No rash.  Neurological:     Mental Status: He is alert and oriented to person, place, and time.  Psychiatric:        Mood and Affect: Mood and affect normal.     ED Results / Procedures / Treatments   Labs (all labs ordered are listed, but only abnormal results are displayed) Labs Reviewed  COMPREHENSIVE METABOLIC PANEL - Abnormal; Notable for the following components:      Result Value   Sodium 124 (*)    Potassium 6.1 (*)    Chloride 91 (*)    Glucose, Bld 156 (*)    BUN 36 (*)    Calcium 8.4 (*)    Total Protein 6.4 (*)    Albumin 2.5 (*)    All other components within normal limits  CBC - Abnormal; Notable for the following components:   WBC 11.1 (*)    RBC 4.05 (*)    Hemoglobin 12.5 (*)    HCT 37.1 (*)    Platelets 490 (*)    All other components within normal limits  LIPASE, BLOOD  URINALYSIS, ROUTINE W REFLEX MICROSCOPIC    EKG None  Radiology No results found.  Procedures Procedures (including critical care time)  Medications Ordered in ED Medications  0.9 %  sodium chloride infusion (has no administration in time range)  promethazine (PHENERGAN) injection 12.5 mg (has no administration in time range)  fentaNYL (SUBLIMAZE) injection 50 mcg (has no administration in time range)    ED Course  I have reviewed the triage vital signs and the nursing notes.  Pertinent labs & imaging results that were available during my care of the patient were reviewed by me and  considered in my medical decision making (see chart for details).    MDM Rules/Calculators/A&P                          Patient to ED with ss/sxs as per HPI.   Labs reviewed: K+ 6.1, Na 124 (last 128 1/14); HGB 12.5 from 13.7 09/12/20. He will require repeat paracentesis, however, with new vomiting, electrolyte derangements feel he would  benefit from further work up, including CT, gentle hydration, symptom relief. .  Patient care signed out to [facility], [place], pending completion of work up, paracentesis via IR, consult to oncology.     Final Clinical Impression(s) / ED Diagnoses Final diagnoses:  None   1. Ascites 2. Pancreatic cancer 3. Nausea, vomiting.  Rx / DC Orders ED Discharge Orders    None       Charlann Lange, PA-C 09/18/20 5625    Merryl Hacker, MD 09/18/20 [phone]

## 2020-09-18 NOTE — ED Notes (Signed)
Recollected and resent urine sample.

## 2020-09-18 NOTE — Procedures (Signed)
PROCEDURE SUMMARY:  Successful US guided paracentesis from RLQ.  Yielded 3 L of bloody fluid.  No immediate complications.  Pt tolerated well.   Specimen was not sent for labs.  EBL < 49mL  Ascencion Dike PA-C 09/18/2020 10:59 AM

## 2020-09-18 NOTE — Progress Notes (Signed)
Events noted.  Unfortunately Mr. Amsden continues to decline with a rapid cancer progression and reaccumulation of ascites.  He is having recurrent nausea and vomiting and possible gastric outlet obstruction that could be contributing to his symptoms. CT scan was personally reviewed and discussed with his daughter Stanton Kidney over the phone.   Based on these findings, his overall prognosis is very poor given his declining performance status, rapid progression of his disease and inability to stay out of the hospital to receive chemotherapy.  Chemotherapy will offer very little palliation at this point.  I recommended at this time transitioning towards comfort care and focus on symptom management.  I agree with palliative medicine consult for symptom management and goals of care.  Consideration for pigtail catheter for recurrent draining of his ascitic fluid would be considered.  I will ask interventional radiology to evaluate his candidacy for that.  His life expectancy is very limited at this time and this reiterated to his daughter Stanton Kidney today.

## 2020-09-18 NOTE — Progress Notes (Addendum)
Patient had yellow MEWS earlier in day shift due to elevation in pulse and low BP. Assessed and rechecked vitals, patient's vitals are currently causing pt to have red MEWS due to low BP and elevated pulse. MD on call notified and rapid nurse called. MD on called came to bedside to assess patient and rapid nurse assessed pt at bedside as well. Red MEWS protocol enforced, vitals rechecked and patient went from red to yellow. Red MEWS protocol will be continued throughout shift and patient will continue to be monitored. MD and rapid nurse aware of new vitals and agree patient is stable, no new orders will be placed as of now. Order was placed for tube feeding, but due to patient's status NP on call gave verbal order to hold off on tube placement and will speak back with family in AM regarding care.  Dawson Bills  09/18/20 2035 09/18/20 2119  Assess: MEWS Score  Temp 97.6 F (36.4 C) 98.7 F (37.1 C)  BP (!) 74/47 94/62  Pulse Rate (!) 113 (!) 103  Resp 18  --   Level of Consciousness Alert  --   SpO2 97 % 99 %  O2 Device Room Air  --   Assess: MEWS Score  MEWS Temp 0 0  MEWS Systolic 2 1  MEWS Pulse 2 1  MEWS RR 0 0  MEWS LOC 0 0  MEWS Score 4 2  MEWS Score Color Red Yellow  Assess: if the MEWS score is Yellow or Red  Were vital signs taken at a resting state? Yes Yes  Focused Assessment No change from prior assessment No change from prior assessment  Early Detection of Sepsis Score *See Row Information* Low Low  MEWS guidelines implemented *See Row Information* Yes No, other (Comment) (red protocol in effect)  Treat  MEWS Interventions Other (Comment) (MD and Rapid notified) Other (Comment) (MD assessed pt at bedside)

## 2020-09-18 NOTE — Significant Event (Signed)
Rapid Response Event Note   Reason for Call :   Red MEWS, Hypotension  Initial Focused Assessment:   Called for new onset red MEWS score. On arrival patient alert sitting up on edge of bed dry heaving with his wife at bedside. Tachycardic but nauseated and dry heaving. Patient has palpable radial pulses despite low cuff pressurees, and no adventitious breath sounds. Bedside RN completing Red MEWS and paging hospitalist service.  Phenergan and Zofran had already been given with no improvement of nausea.   Interventions:   NP at bedside to discuss plan of care, comfort care versus escalation of care.   Plan of Care:   Wife and husband discussing palliative approach, currently DNR. Plan at this time to focus on symptom management. Not currently comfort care, NP discussing plan of care.   MD Notified:  Call Time: 2100 Arrival Time: 2103 End Time: 2115  Mervyn Skeeters, RN

## 2020-09-18 NOTE — ED Notes (Signed)
ED TO INPATIENT HANDOFF REPORT  Name/Age/Gender Jesse Frye 65 y.o. male  Code Status    Code Status Orders  (From admission, onward)         Start     Ordered   09/18/20 1013  Full code  Continuous        09/18/20 1014        Code Status History    Date Active Date Inactive Code Status Order ID Comments User Context   09/05/2020 1630 09/10/2020 1715 Full Code [id number]  [name], DO Inpatient   Advance Care Planning Activity      Home/SNF/Other Home  Chief Complaint Hyponatremia [E87.1]  Level of Care/Admitting Diagnosis ED Disposition    ED Disposition Condition [place] [facility]: [facility] [id number]  Level of Care: Med-Surg [16]  Covid Evaluation: Confirmed COVID Negative  Diagnosis: Hyponatremia [id number]  Admitting Physician: Neena Rhymes [[id number]]  Attending Physician: Neena Rhymes [[id number]]       Medical History Past Medical History:  Diagnosis Date  . Abdominal pain on and off for 2 months  . Abdominal swelling   . Cancer [facility])    metastatic pancreatic cancer  . History of rectal bleeding    for 1 week  . Hyperlipidemia    no meds    Allergies Allergies  Allergen Reactions  . Penicillins Shortness Of Breath and Other (See Comments)    Childhood reaction Other reaction(s): Dizziness Tolerated amoxicillin in 2016 and Augmentin in 2018    IV Location/Drains/Wounds Patient Lines/Drains/Airways Status    Active Line/Drains/Airways    Name Placement date Placement time Site Days   Implanted Port 09/07/20 Right Chest 09/07/20  1720  Chest  11   Peripheral IV 09/18/20 Right Antecubital 09/18/20  0645  Antecubital  less than 1   Peripheral IV 09/18/20 Right;Posterior Hand 09/18/20  1605  Hand  less than 1          Labs/Imaging Results for orders placed or performed during the hospital encounter of 09/18/20 (from the past 48 hour(s))  Lipase, blood     Status: None   Collection Time:  09/17/20 11:07 PM  Result Value Ref Range   Lipase 47 11 - 51 U/L    Comment: Performed at [facility], [place] [address] [place], [place] [address]  Comprehensive metabolic panel     Status: Abnormal   Collection Time: 09/17/20 11:07 PM  Result Value Ref Range   Sodium 124 (L) 135 - 145 mmol/L   Potassium 6.1 (H) 3.5 - 5.1 mmol/L   Chloride 91 (L) 98 - 111 mmol/L   CO2 22 22 - 32 mmol/L   Glucose, Bld 156 (H) 70 - 99 mg/dL    Comment: Glucose reference range applies only to samples taken after fasting for at least 8 hours.   BUN 36 (H) 8 - 23 mg/dL   Creatinine, Ser 1.16 0.61 - 1.24 mg/dL   Calcium 8.4 (L) 8.9 - 10.3 mg/dL   Total Protein 6.4 (L) 6.5 - 8.1 g/dL   Albumin 2.5 (L) 3.5 - 5.0 g/dL   AST 24 15 - 41 U/L   ALT 19 0 - 44 U/L   Alkaline Phosphatase 55 38 - 126 U/L   Total Bilirubin 0.7 0.3 - 1.2 mg/dL   GFR, Estimated >60 >60 mL/min    Comment: (NOTE) Calculated using the CKD-EPI Creatinine Equation (2021)    Anion gap 11 5 - 15  Comment: Performed at [facility], [place] [address] [place], [place] [address]  CBC     Status: Abnormal   Collection Time: 09/17/20 11:07 PM  Result Value Ref Range   WBC 11.1 (H) 4.0 - 10.5 K/uL   RBC 4.05 (L) 4.22 - 5.81 MIL/uL   Hemoglobin 12.5 (L) 13.0 - 17.0 g/dL   HCT 37.1 (L) 39.0 - 52.0 %   MCV 91.6 80.0 - 100.0 fL   MCH 30.9 26.0 - 34.0 pg   MCHC 33.7 30.0 - 36.0 g/dL   RDW 13.0 11.5 - 15.5 %   Platelets 490 (H) 150 - 400 K/uL   nRBC 0.0 0.0 - 0.2 %    Comment: Performed at [facility], [place] [address], [place], [place] [address]  Resp Panel by RT-PCR (Flu A&B, Covid) Nasopharyngeal Swab     Status: None   Collection Time: 09/18/20  6:30 AM   Specimen: Nasopharyngeal Swab; Nasopharyngeal(NP) swabs in vial transport medium  Result Value Ref Range   SARS Coronavirus 2 by RT PCR NEGATIVE NEGATIVE    Comment: (NOTE) SARS-CoV-2 target nucleic acids are NOT DETECTED.  The  SARS-CoV-2 RNA is generally detectable in upper respiratory specimens during the acute phase of infection. The lowest concentration of SARS-CoV-2 viral copies this assay can detect is 138 copies/mL. A negative result does not preclude SARS-Cov-2 infection and should not be used as the sole basis for treatment or other patient management decisions. A negative result may occur with  improper specimen collection/handling, submission of specimen other than nasopharyngeal swab, presence of viral mutation(s) within the areas targeted by this assay, and inadequate number of viral copies(<138 copies/mL). A negative result must be combined with clinical observations, patient history, and epidemiological information. The expected result is Negative.  Fact Sheet for Patients:  EntrepreneurPulse.com.au  Fact Sheet for Healthcare Providers:  IncredibleEmployment.be  This test is no t yet approved or cleared by the [place] FDA and  has been authorized for detection and/or diagnosis of SARS-CoV-2 by FDA under an Emergency Use Authorization (EUA). This EUA will remain  in effect (meaning this test can be used) for the duration of the COVID-19 declaration under Section 564(b)(1) of the Act, 21 U.S.C.section 360bbb-3(b)(1), unless the authorization is terminated  or revoked sooner.       Influenza A by PCR NEGATIVE NEGATIVE   Influenza B by PCR NEGATIVE NEGATIVE    Comment: (NOTE) The Xpert Xpress SARS-CoV-2/FLU/RSV plus assay is intended as an aid in the diagnosis of influenza from Nasopharyngeal swab specimens and should not be used as a sole basis for treatment. Nasal washings and aspirates are unacceptable for Xpert Xpress SARS-CoV-2/FLU/RSV testing.  Fact Sheet for Patients: EntrepreneurPulse.com.au  Fact Sheet for Healthcare Providers: IncredibleEmployment.be  This test is not yet approved or cleared by the  [place] FDA and has been authorized for detection and/or diagnosis of SARS-CoV-2 by FDA under an Emergency Use Authorization (EUA). This EUA will remain in effect (meaning this test can be used) for the duration of the COVID-19 declaration under Section 564(b)(1) of the Act, 21 U.S.C. section 360bbb-3(b)(1), unless the authorization is terminated or revoked.  Performed at [facility], [place] [address], [place], [place] [address]    CT ABDOMEN PELVIS W CONTRAST  Result Date: 09/18/2020 CLINICAL DATA:  Right upper quadrant abdominal pain and abdominal distension, metastatic pancreatic carcinoma. Rectal bleeding. Prior rectal fistula repair. Multiple recent paracentesis, most recently 4 days ago. EXAM: CT  ABDOMEN AND PELVIS WITH CONTRAST TECHNIQUE: Multidetector CT imaging of the abdomen and pelvis was performed using the standard protocol following bolus administration of intravenous contrast. CONTRAST:  134mL OMNIPAQUE IOHEXOL 300 MG/ML  SOLN COMPARISON:  08/21/2020 FINDINGS: Lower chest: Linear subsegmental atelectasis in the lingula. Faint mosaic attenuation in the bases of the lower lobes, possibly from air trapping. A central catheter tip terminates at the lower cavoatrial junction. A lower paraesophageal lymph node measures 1.5 cm in short axis on image 5 of series 2, previously 1.6 cm. There is a suggestion of a small hiatal hernia potentially with tumor from the peripancreatic region extending up through the hiatus. Hepatobiliary: Newly appreciable 1.4 by 1.6 cm hypodense lesion posteriorly in the right hepatic lobe on image 17 of series 2, concerning for metastatic disease. Gallbladder unremarkable. The previous small foci of right portal vein thrombus shown on 08/21/2020 have resolved. Pancreas: Rapidly enlarging pancreatic mass extending along the lesser curvature of the stomach and also extending posteriorly adjacent to the duodenum. The anterior component measures  approximately 6.6 by 8.0 by 6.7 cm and is of higher density, as measured on image 20 of series 2. The component in the pancreatic head and body measures 6.5 by 4.2 by 5.6 cm on image 28 of series 2, and formerly measured about 4.8 by 2.7 by 4.5 cm. A third area of tumor along the posterior margin of the pancreas in fell of the SMA and measures 5.0 by 3.9 by 3.5 cm. All these elements of substantially enlarged compared to 08/21/2020. Spleen: Unremarkable Adrenals/Urinary Tract: Unremarkable Stomach/Bowel: The anterior component of tumor along the lesser sac is intimately associated with lesser curvature of the stomach and invasion of the gastric wall is not readily excluded. The duodenum is displaced by tumor. Scattered diverticula of the descending colon. Vascular/Lymphatic: Aortoiliac atherosclerotic vascular disease. 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 There some collateral vessels around the spleen and stomach. Indistinctly marginated porta hepatis adenopathy substantially worsened from 08/21/2014. Portacaval node 2.3 cm in short axis on image 27 of series 2. Reproductive: Unremarkable Other: Extensive and rapid progression of tumor caking in the omentum and along peritoneal surfaces especially in the pelvis where the tumor rind is particularly thick informs masslike components measuring 4.5 cm in thickness on the left and 3.0 cm in thickness on the right on image 69 of series 2. Malignant ascites noted, moderate in amount. Fluid infiltration of the mesentery diffusely. Musculoskeletal: The known subacute fracture the right anterior seventh rib is not included on today's images. IMPRESSION: 1. Extensive and rapid progression of tumor caking in the omentum and along peritoneal surfaces especially in the pelvis, with moderate malignant ascites. 2. Rapidly enlarging pancreatic and peripancreatic masses. There is also a newly  appreciable 1.4 by 1.6 cm hypodense lesion posteriorly in the right hepatic lobe concerning for metastatic disease. 3. The previous small foci of right portal vein thrombus shown on 08/21/2020 have resolved. 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 4. There is a suggestion of a small hiatal hernia potentially with tumor extending up through the hiatus. Adjacent lower paraesophageal adenopathy. 5. Faint mosaic attenuation in the bases of the lower lobes, possibly from air trapping. 6. Aortic atherosclerosis. Aortic Atherosclerosis (ICD10-I70.0). Electronically Signed   By: Van Clines M.D.   On: 09/18/2020 08:32   US Paracentesis  Result Date: 09/18/2020 INDICATION: Patient  with history of pancreatic cancer with recurrent malignant ascites. Request for therapeutic paracentesis. EXAM: ULTRASOUND GUIDED RIGHT LOWER QUADRANT PARACENTESIS MEDICATIONS: 1% plain lidocaine, 5 mL COMPLICATIONS: None immediate. PROCEDURE: Informed written consent was obtained from the patient after a discussion of the risks, benefits and alternatives to treatment. A timeout was performed prior to the initiation of the procedure. Initial ultrasound scanning demonstrates a moderate to large amount of ascites within the right lower abdominal quadrant. The right lower abdomen was prepped and draped in the usual sterile fashion. 1% lidocaine was used for local anesthesia. Following this, a 19 gauge, 7-cm, Yueh catheter was introduced. An ultrasound image was saved for documentation purposes. The paracentesis was performed. The catheter was removed and a dressing was applied. The patient tolerated the procedure well without immediate post procedural complication. FINDINGS: A total of approximately 3 L of thin bloody ascitic fluid was removed. IMPRESSION: Successful ultrasound-guided paracentesis yielding 3 liters of peritoneal fluid. Read by: Ascencion Dike PA-C Electronically Signed   By: Markus Daft M.D.   On: 09/18/2020 10:58    Pending Labs Unresulted Labs (From admission, onward)          Start     Ordered   09/25/20 0500  Creatinine, serum  (enoxaparin (LOVENOX)    CrCl >/= 30 ml/min)  Weekly,   R     Comments: while on enoxaparin therapy    09/18/20 1014   10/07/2020 0814  Basic metabolic panel  Tomorrow morning,   R        09/18/20 1014   09/18/20 1700  Urinalysis, Routine w reflex microscopic  Once,   R        09/18/20 1700          Vitals/Pain Today's Vitals   09/18/20 1446 09/18/20 1600 09/18/20 1615 09/18/20 1618  BP:  (!) 87/61 90/70 101/73  Pulse:  (!) 124 (!) 121 (!) 119  Resp:  18 12 14   Temp:      TempSrc:      SpO2:  96% 98% 98%  PainSc: 3        Isolation Precautions No active isolations  Medications Medications  ondansetron (ZOFRAN) tablet 4 mg (has no administration in time range)  polyethylene glycol (MIRALAX / GLYCOLAX) packet 17 g (has no administration in time range)  senna-docusate (Senokot-S) tablet 1 tablet (1 tablet Oral Given 09/18/20 1126)  lidocaine-prilocaine (EMLA) cream 1 application (has no administration in time range)  enoxaparin (LOVENOX) injection 40 mg (40 mg Subcutaneous Given 09/18/20 1115)  0.9 %  sodium chloride infusion ( Intravenous New Bag/Given 09/18/20 1115)  ondansetron (ZOFRAN) injection 4 mg (4 mg Intravenous Given 09/18/20 1610)  morphine 4 MG/ML injection 4 mg (4 mg Intravenous Given 09/18/20 1153)  pantoprazole (PROTONIX) injection 40 mg (40 mg Intravenous Given 09/18/20 1116)  sucralfate (CARAFATE) 1 GM/10ML suspension 1 g (1 g Oral Given 09/18/20 1116)  0.9 %  sodium chloride infusion ( Intravenous Stopped 09/18/20 1459)  promethazine (PHENERGAN) injection 12.5 mg (12.5 mg Intravenous Given 09/18/20 0646)  fentaNYL (SUBLIMAZE) injection 50 mcg (50 mcg Intravenous Given 09/18/20 0645)  iohexol (OMNIPAQUE) 300 MG/ML solution 100 mL (100 mLs Intravenous Contrast Given  09/18/20 0731)  lidocaine (XYLOCAINE) 1 % (with pres) injection (  Given by Other 09/18/20 1459)  sodium chloride 0.9 % bolus 500 mL (0 mLs Intravenous Stopped 09/18/20 1648)    Mobility non-ambulatory

## 2020-09-18 NOTE — ED Provider Notes (Signed)
Care handoff received from Charlann Lange PA-C at shift change please see previous providers note for full details of visit.  In short 65 year old male with recently diagnosed pancreatic cancer presented with abdominal extension abdominal pain nausea vomiting.  He has required frequent paracenteses over the last week and exam today appears consistent with ascites.  Labs show hyperkalemia K6.1, hyponatremia sodium 124, hemoglobin 12.5.  He is receiving IV fluids, plan of care CT abdomen pelvis followed by IR consultation for paracentesis and oncology consult. Physical Exam  BP 118/78 (BP Location: Left Arm)   Pulse 93   Temp 98.2 F (36.8 C) (Oral)   Resp 18   SpO2 98%   Physical Exam Constitutional:      General: He is not in acute distress.    Appearance: Normal appearance. He is well-developed. He is ill-appearing. He is not toxic-appearing or diaphoretic.  HENT:     Head: Normocephalic and atraumatic.  Eyes:     General: Vision grossly intact. Gaze aligned appropriately.     Pupils: Pupils are equal, round, and reactive to light.  Neck:     Trachea: Trachea and phonation normal.  Pulmonary:     Effort: Pulmonary effort is normal. No respiratory distress.  Abdominal:     General: There is distension.     Palpations: Abdomen is soft.     Tenderness: There is generalized abdominal tenderness. There is no guarding or rebound.  Musculoskeletal:        General: Normal range of motion.     Cervical back: Normal range of motion.  Skin:    General: Skin is warm and dry.  Neurological:     Mental Status: He is alert.     GCS: GCS eye subscore is 4. GCS verbal subscore is 5. GCS motor subscore is 6.     Comments: Speech is clear and goal oriented, follows commands Major Cranial nerves without deficit, no facial droop Moves extremities without ataxia, coordination intact  Psychiatric:        Behavior: Behavior normal.     ED Course/Procedures   Clinical Course as of 09/18/20 1022   Tue Sep 18, 2020  0939 Dr. Linda Hedges [BM]    Clinical Course User Index [BM] Deliah Boston, PA-C    Procedures  MDM  I reassessed the patient he is resting in bed daughter at bedside.  No additional vomiting since receiving Phenergan.   - I reviewed and interpreted labs which include: CBC with mild leukocytosis of 11.1, mild anemia of 12.5. CMP shows hyponatremia 124, hyperkalemia 6.1, no LFT elevations or gap. COVID/influenza panel negative. Lipase within normal limits.  EKG: Vent. rate 102 BPM PR interval * ms QRS duration 91 ms QT/QTc 343/447 ms P-R-T axes 38 78 34 Sinus tachycardia Confirmed by Dewaine Conger [id number]) on 09/18/2020 7:06:41 AM  CTAP:  IMPRESSION:  1. Extensive and rapid progression of tumor caking in the omentum  and along peritoneal surfaces especially in the pelvis, with  moderate malignant ascites.  2. Rapidly enlarging pancreatic and peripancreatic masses. There is  also a newly appreciable 1.4 by 1.6 cm hypodense lesion posteriorly  in the right hepatic lobe concerning for metastatic disease.  3. The previous small foci of right portal vein thrombus shown on  08/21/2020 have resolved. 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  4. There is a suggestion of a small hiatal hernia potentially with  tumor extending up  through the hiatus. Adjacent lower paraesophageal  adenopathy.  5. Faint mosaic attenuation in the bases of the lower lobes,  possibly from air trapping.  6. Aortic atherosclerosis.    Aortic Atherosclerosis (ICD10-I70.0).  - 9:17 AM: Consult with oncologist Dr. Alen Blew who agrees with medicine admission and will consult on patient.  I went to update the patient and family but he has been taken for paracentesis by IR.  9:39 AM: Consult with Dr. Linda Hedges, patient accepted for hospitalist admission.  Note: Portions of this report may have been  transcribed using voice recognition software. Every effort was made to ensure accuracy; however, inadvertent computerized transcription errors may still be present.   Gari Crown 09/18/20 1022    Breck Coons, MD 09/18/20 1049

## 2020-09-18 NOTE — Progress Notes (Signed)
Spoke w/ pt's daughter today regarding financial assistance.  She informed me that her dad only has a couple of weeks to live and would going on Hospice at home very soon.  I expressed how sorry I was to hear that and advised her to contact Lauren his Education officer, museum for additional assistance because the $1000 grant are for pt's that are in active treatment.  She verbalized understanding.

## 2020-09-19 ENCOUNTER — Other Ambulatory Visit (HOSPITAL_COMMUNITY): Payer: Self-pay | Admitting: Family Medicine

## 2020-09-19 DIAGNOSIS — C259 Malignant neoplasm of pancreas, unspecified: Secondary | ICD-10-CM

## 2020-09-19 DIAGNOSIS — N179 Acute kidney failure, unspecified: Secondary | ICD-10-CM

## 2020-09-19 DIAGNOSIS — Z515 Encounter for palliative care: Secondary | ICD-10-CM

## 2020-09-19 DIAGNOSIS — R188 Other ascites: Secondary | ICD-10-CM

## 2020-09-19 DIAGNOSIS — G893 Neoplasm related pain (acute) (chronic): Secondary | ICD-10-CM

## 2020-09-19 DIAGNOSIS — E889 Metabolic disorder, unspecified: Secondary | ICD-10-CM

## 2020-09-19 DIAGNOSIS — Z7189 Other specified counseling: Secondary | ICD-10-CM

## 2020-09-19 DIAGNOSIS — R110 Nausea: Secondary | ICD-10-CM

## 2020-09-19 DIAGNOSIS — R531 Weakness: Secondary | ICD-10-CM

## 2020-09-19 LAB — BASIC METABOLIC PANEL
Anion gap: 19 — ABNORMAL HIGH (ref 5–15)
BUN: 66 mg/dL — ABNORMAL HIGH (ref 8–23)
CO2: 12 mmol/L — ABNORMAL LOW (ref 22–32)
Calcium: 7.8 mg/dL — ABNORMAL LOW (ref 8.9–10.3)
Chloride: 95 mmol/L — ABNORMAL LOW (ref 98–111)
Creatinine, Ser: 3.06 mg/dL — ABNORMAL HIGH (ref 0.61–1.24)
GFR, Estimated: 22 mL/min — ABNORMAL LOW (ref >60)
Glucose, Bld: 126 mg/dL — ABNORMAL HIGH (ref 70–99)
Potassium: >7.5 mmol/L (ref 3.5–5.1)
Sodium: 126 mmol/L — ABNORMAL LOW (ref 135–145)

## 2020-09-19 LAB — GLUCOSE, CAPILLARY
Glucose-Capillary: 124 mg/dL — ABNORMAL HIGH (ref 70–99)
Glucose-Capillary: 111 mg/dL — ABNORMAL HIGH (ref 70–99)

## 2020-09-19 MED ORDER — LORAZEPAM 0.5 MG PO TABS: 0.5000 mg | ORAL_TABLET | ORAL | 0 refills | Status: DC | PRN

## 2020-09-19 MED ORDER — ONDANSETRON HCL 4 MG PO TABS: 8.0000 mg | ORAL_TABLET | Freq: Four times a day (QID) | ORAL | Status: DC | PRN

## 2020-09-19 MED ORDER — LORAZEPAM 0.5 MG PO TABS
0.5000 mg | ORAL_TABLET | ORAL | Status: DC | PRN
  Administered 2020-09-19: 0.5 mg via ORAL
  Filled 2020-09-19: qty 1

## 2020-09-19 MED ORDER — HYDROMORPHONE HCL 1 MG/ML PO LIQD
1.0000 mg | ORAL | Status: DC | PRN
  Administered 2020-09-19: 2 mg via ORAL
  Filled 2020-09-19: qty 2

## 2020-09-19 MED ORDER — MORPHINE SULFATE (CONCENTRATE) 20 MG/ML PO SOLN: 10.0000 mg | ORAL | 0 refills | Status: AC | PRN

## 2020-09-19 MED ORDER — SODIUM CHLORIDE 0.9 % IV SOLN: Freq: Once | INTRAVENOUS | Status: DC

## 2020-09-19 MED ORDER — ENOXAPARIN SODIUM 30 MG/0.3ML ~~LOC~~ SOLN: 30.0000 mg | Freq: Every day | SUBCUTANEOUS | Status: DC

## 2020-09-19 MED ORDER — SODIUM ZIRCONIUM CYCLOSILICATE 10 G PO PACK
10.0000 g | PACK | Freq: Once | ORAL | Status: DC
  Filled 2020-09-19: qty 1

## 2020-09-19 MED ORDER — ONDANSETRON 8 MG PO TBDP: 8.0000 mg | ORAL_TABLET | Freq: Three times a day (TID) | ORAL | 0 refills | Status: DC | PRN

## 2020-09-19 MED FILL — ONDANSETRON ODT 8 MG TABLET: 8 | 6 days supply | Qty: 20 | Fill #0

## 2020-09-19 MED FILL — LORazepam 0.5 MG TABS: 0.5 | 7 days supply | Qty: 42 | Fill #0

## 2020-09-19 MED FILL — MORPHINE SULF 100 MG/5 ML S: 100 | 10 days supply | Qty: 30 | Fill #0

## 2020-09-20 DIAGNOSIS — Z515 Encounter for palliative care: Secondary | ICD-10-CM

## 2020-09-25 ENCOUNTER — Inpatient Hospital Stay: Payer: Self-pay

## 2020-10-01 ENCOUNTER — Inpatient Hospital Stay: Payer: Self-pay | Admitting: Oncology

## 2020-10-01 ENCOUNTER — Inpatient Hospital Stay: Payer: Self-pay

## 2020-10-02 ENCOUNTER — Inpatient Hospital Stay: Payer: Self-pay

## 2020-10-02 NOTE — Death Summary Note (Signed)
DEATH SUMMARY   Patient Details  Name: Jesse Frye MRN: [id number] DOB: 02-Oct-1955  Admission/Discharge Information   Admit Date:  10/17/2020  Date of Death: Date of Death:  (10/18/20)  Time of Death: Time of Death: 01/06/01  Length of Stay: 0  Referring Physician: Patient, No Pcp Per   Reason(s) for Hospitalization  Abdominal pain  Diagnoses  Preliminary cause of death:  Secondary Diagnoses (including complications and co-morbidities):  Principal Problem:   Malignant neoplasm of pancreas ([place]) Active Problems:   Ascites   Generalized abdominal pain   Hyponatremia   Hyperkalemia   Hospice care patient   Comfort measures only status   Brief Hospital Course (including significant findings, care, treatment, and services provided and events leading to death)  Jesse Frye is a 65 y.o. year old male who presented secondary to increasing abdominal pain, abdominal swelling and anorexia in setting of known pancreatic cancer with noted respidly progressing lesion. IR consulted and performed therapeutic paracentesis on 10/17/22 yielding 3 L of blood fluid. Oncology evaluated patient while inpatient and recommended Hospice. After discussion with patient and patient's wife, decision was made to get patient home as quickly as possible as he was likely to die within hours. Prior to discharge home with hospice, however, patient died while inpatient.    Pertinent Labs and Studies  Significant Diagnostic Studies CT CHEST W CONTRAST  Result Date: 09/07/2020 CLINICAL DATA:  Pancreatic carcinoma. EXAM: CT CHEST WITH CONTRAST TECHNIQUE: Multidetector CT imaging of the chest was performed during intravenous contrast administration. CONTRAST:  65mL OMNIPAQUE IOHEXOL 300 MG/ML  SOLN COMPARISON:  Abdomen CT on 08/21/2020; no prior chest CT FINDINGS: Cardiovascular: No acute findings. Aortic atherosclerotic calcification noted. Mediastinum/Nodes: No masses or pathologically enlarged lymph nodes  identified. Lungs/Pleura: Linear opacity in both lower lobes is consistent with subsegmental atelectasis. No evidence of pulmonary infiltrate or pleural effusion. A 3 mm pulmonary nodule is seen in the medial right upper lobe on image 33/5, and a 3 mm nodule is seen in the left lung apex on image 18/5, which are nonspecific but relatively unlikely to represent metastatic disease. Upper Abdomen: Large pancreatic soft tissue mass in the central upper abdomen has increased since previous study. There is new perihepatic ascites and diffuse omental soft tissue stranding, consistent with worsening peritoneal carcinomatosis. Musculoskeletal:  No suspicious bone lesions. IMPRESSION: No definite metastatic disease within the thorax. 2 tiny bilateral upper lobe pulmonary nodules are nonspecific, but unlikely represent metastatic disease. Recommend continued attention on follow-up imaging. Bilateral lower lobe subsegmental atelectasis. Increased size of large pancreatic soft tissue mass and worsening peritoneal carcinomatosis and perihepatic ascites. Aortic Atherosclerosis (ICD10-I70.0). Electronically Signed   By: Marlaine Hind M.D.   On: 09/07/2020 19:40   CT ABDOMEN PELVIS W CONTRAST  Result Date: 2020/10/17 CLINICAL DATA:  Right upper quadrant abdominal pain and abdominal distension, metastatic pancreatic carcinoma. Rectal bleeding. Prior rectal fistula repair. Multiple recent paracentesis, most recently 4 days ago. EXAM: CT ABDOMEN AND PELVIS WITH CONTRAST TECHNIQUE: Multidetector CT imaging of the abdomen and pelvis was performed using the standard protocol following bolus administration of intravenous contrast. CONTRAST:  163mL OMNIPAQUE IOHEXOL 300 MG/ML  SOLN COMPARISON:  08/21/2020 FINDINGS: Lower chest: Linear subsegmental atelectasis in the lingula. Faint mosaic attenuation in the bases of the lower lobes, possibly from air trapping. A central catheter tip terminates at the lower cavoatrial junction. A lower  paraesophageal lymph node measures 1.5 cm in short axis on image 5 of series 2, previously 1.6 cm.  There is a suggestion of a small hiatal hernia potentially with tumor from the peripancreatic region extending up through the hiatus. Hepatobiliary: Newly appreciable 1.4 by 1.6 cm hypodense lesion posteriorly in the right hepatic lobe on image 17 of series 2, concerning for metastatic disease. Gallbladder unremarkable. The previous small foci of right portal vein thrombus shown on 08/21/2020 have resolved. Pancreas: Rapidly enlarging pancreatic mass extending along the lesser curvature of the stomach and also extending posteriorly adjacent to the duodenum. The anterior component measures approximately 6.6 by 8.0 by 6.7 cm and is of higher density, as measured on image 20 of series 2. The component in the pancreatic head and body measures 6.5 by 4.2 by 5.6 cm on image 28 of series 2, and formerly measured about 4.8 by 2.7 by 4.5 cm. A third area of tumor along the posterior margin of the pancreas in fell of the SMA and measures 5.0 by 3.9 by 3.5 cm. All these elements of substantially enlarged compared to 08/21/2020. Spleen: Unremarkable Adrenals/Urinary Tract: Unremarkable Stomach/Bowel: The anterior component of tumor along the lesser sac is intimately associated with lesser curvature of the stomach and invasion of the gastric wall is not readily excluded. The duodenum is displaced by tumor. Scattered diverticula of the descending colon. Vascular/Lymphatic: Aortoiliac atherosclerotic vascular disease. 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 There some collateral vessels around the spleen and stomach. Indistinctly marginated porta hepatis adenopathy substantially worsened from 08/21/2014. Portacaval node 2.3 cm in short axis on image 27 of series 2. Reproductive: Unremarkable Other: Extensive and rapid progression of tumor caking in  the omentum and along peritoneal surfaces especially in the pelvis where the tumor rind is particularly thick informs masslike components measuring 4.5 cm in thickness on the left and 3.0 cm in thickness on the right on image 69 of series 2. Malignant ascites noted, moderate in amount. Fluid infiltration of the mesentery diffusely. Musculoskeletal: The known subacute fracture the right anterior seventh rib is not included on today's images. IMPRESSION: 1. Extensive and rapid progression of tumor caking in the omentum and along peritoneal surfaces especially in the pelvis, with moderate malignant ascites. 2. Rapidly enlarging pancreatic and peripancreatic masses. There is also a newly appreciable 1.4 by 1.6 cm hypodense lesion posteriorly in the right hepatic lobe concerning for metastatic disease. 3. The previous small foci of right portal vein thrombus shown on 08/21/2020 have resolved. 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 4. There is a suggestion of a small hiatal hernia potentially with tumor extending up through the hiatus. Adjacent lower paraesophageal adenopathy. 5. Faint mosaic attenuation in the bases of the lower lobes, possibly from air trapping. 6. Aortic atherosclerosis. Aortic Atherosclerosis (ICD10-I70.0). Electronically Signed   By: Van Clines M.D.   On: 09/18/2020 08:32   US Paracentesis  Result Date: 09/18/2020 INDICATION: Patient with history of pancreatic cancer with recurrent malignant ascites. Request for therapeutic paracentesis. EXAM: ULTRASOUND GUIDED RIGHT LOWER QUADRANT PARACENTESIS MEDICATIONS: 1% plain lidocaine, 5 mL COMPLICATIONS: None immediate. PROCEDURE: Informed written consent was obtained from the patient after a discussion of the risks, benefits and alternatives to treatment. A timeout was performed prior to the initiation of the procedure. Initial ultrasound scanning demonstrates  a moderate to large amount of ascites within the right lower abdominal quadrant. The right lower abdomen was prepped and draped in the usual sterile fashion. 1%  lidocaine was used for local anesthesia. Following this, a 19 gauge, 7-cm, Yueh catheter was introduced. An ultrasound image was saved for documentation purposes. The paracentesis was performed. The catheter was removed and a dressing was applied. The patient tolerated the procedure well without immediate post procedural complication. FINDINGS: A total of approximately 3 L of thin bloody ascitic fluid was removed. IMPRESSION: Successful ultrasound-guided paracentesis yielding 3 liters of peritoneal fluid. Read by: Ascencion Dike PA-C Electronically Signed   By: Markus Daft M.D.   On: 09/18/2020 10:58   US Paracentesis  Result Date: 09/14/2020 INDICATION: Patient with history of pancreatic cancer with recurrent malignant ascites. Request received for therapeutic paracentesis. EXAM: ULTRASOUND GUIDED THERAPEUTIC PARACENTESIS MEDICATIONS: 1% lidocaine to skin and subcutaneous tissue COMPLICATIONS: None immediate. PROCEDURE: Informed written consent was obtained from the patient via interpreter after a discussion of the risks, benefits and alternatives to treatment. A timeout was performed prior to the initiation of the procedure. Initial ultrasound scanning demonstrates a moderate amount of ascites within the right upper abdominal quadrant. The right upper abdomen was prepped and draped in the usual sterile fashion. 1% lidocaine was used for local anesthesia. Following this, a 19 gauge, 7-cm, Yueh catheter was introduced. An ultrasound image was saved for documentation purposes. The paracentesis was performed. The catheter was removed and a dressing was applied. The patient tolerated the procedure well without immediate post procedural complication. FINDINGS: A total of approximately 2.9 liters of bloody fluid was removed. IMPRESSION: Successful  ultrasound-guided therapeutic paracentesis yielding 2.9 liters of peritoneal fluid. Read by: Rowe Robert, PA-C Electronically Signed   By: Ruthann Cancer MD   On: 09/14/2020 13:37   US Paracentesis  Result Date: 09/12/2020 INDICATION: Patient with history of pancreatic cancer with recurrent malignant ascites. Request received for therapeutic paracentesis. EXAM: ULTRASOUND GUIDED THERAPEUTIC PARACENTESIS MEDICATIONS: 1% lidocaine to skin and subcutaneous tissue COMPLICATIONS: None immediate. PROCEDURE: Informed written consent was obtained from the patient after a discussion of the risks, benefits and alternatives to treatment. A timeout was performed prior to the initiation of the procedure. Initial ultrasound scanning demonstrates a moderate amount of ascites within the right lower abdominal quadrant. The right lower abdomen was prepped and draped in the usual sterile fashion. 1% lidocaine was used for local anesthesia. Following this, a 6 Fr Safe-T-Centesis catheter was introduced. An ultrasound image was saved for documentation purposes. The paracentesis was performed. The catheter was removed and a dressing was applied. The patient tolerated the procedure well without immediate post procedural complication. FINDINGS: A total of approximately 3.2 liters of turbid, light bloody fluid was removed. IMPRESSION: Successful ultrasound-guided therapeutic paracentesis yielding 3.2 liters of peritoneal fluid. Read by: Rowe Robert, PA-C Electronically Signed   By: Sandi Mariscal M.D.   On: 09/12/2020 16:39   US Paracentesis  Result Date: 09/09/2020 INDICATION: Patient with history of pancreatic mass, recurrent ascites. Request is made for diagnostic and therapeutic paracentesis. EXAM: ULTRASOUND GUIDED DIAGNOSTIC AND THERAPEUTIC PARACENTESIS MEDICATIONS: 10 mL 1% lidocaine COMPLICATIONS: None immediate. PROCEDURE: Informed written consent was obtained from the patient after a discussion of the risks, benefits and  alternatives to treatment. A timeout was performed prior to the initiation of the procedure. Initial ultrasound scanning demonstrates a small amount of ascites within the right lower abdominal quadrant. The right lower abdomen was prepped and draped in the usual sterile fashion. 1% lidocaine was used for local anesthesia. Following this, a 19 gauge, 7-cm, Yueh catheter was introduced. An ultrasound image was saved for documentation purposes.  The paracentesis was performed. The catheter was removed and a dressing was applied. The patient tolerated the procedure well without immediate post procedural complication. FINDINGS: A total of approximately 2.0 liters of bloody fluid was removed. Samples were sent to the laboratory as requested by the clinical team. IMPRESSION: Successful ultrasound-guided paracentesis yielding 2.0 liters of peritoneal fluid. Read by: Brynda Greathouse PA-C Electronically Signed   By: Jerilynn Mages.  Shick M.D.   On: 09/09/2020 11:50   US Paracentesis  Result Date: 09/05/2020 INDICATION: Patient with history of abdominal pain, nausea, vomiting, abdominal distension/ascites, pancreatic mass; request received for diagnostic and therapeutic paracentesis. EXAM: ULTRASOUND GUIDED DIAGNOSTIC AND THERAPEUTIC PARACENTESIS MEDICATIONS: 1% lidocaine to skin and subcutaneous tissue COMPLICATIONS: None immediate. PROCEDURE: Informed written consent was obtained from the patient after a discussion of the risks, benefits and alternatives to treatment. A timeout was performed prior to the initiation of the procedure. Initial ultrasound scanning demonstrates a large amount of ascites within the right lower abdominal quadrant. The right lower abdomen was prepped and draped in the usual sterile fashion. 1% lidocaine was used for local anesthesia. Following this, a 19 gauge, 10-cm, Yueh catheter was introduced. An ultrasound image was saved for documentation purposes. The paracentesis was performed. The catheter was removed  and a dressing was applied. The patient tolerated the procedure well without immediate post procedural complication. FINDINGS: A total of approximately 6.1 liters of hazy, slightly blood tinged/amber fluid was removed. Samples were sent to the laboratory as requested by the clinical team. IMPRESSION: Successful ultrasound-guided diagnostic and therapeutic paracentesis yielding 6.1 liters of peritoneal fluid. Read by: Rowe Robert, PA-C Electronically Signed   By: Aletta Edouard M.D.   On: 09/05/2020 16:21   DG ABD ACUTE 2+V W 1V CHEST  Result Date: 09/12/2020 CLINICAL DATA:  Abdominal pain, history of ascites and paracentesis, pancreatic neoplasm EXAM: DG ABDOMEN ACUTE WITH 1 VIEW CHEST COMPARISON:  09/05/2020 FINDINGS: RIGHT subclavian Port-A-Cath with tip projecting over cavoatrial junction, new. Normal heart size, mediastinal contours, and pulmonary vascularity. Atherosclerotic calcification aorta. Decreased basilar atelectasis and accentuation of perihilar markings. No acute infiltrate, pleural effusion or pneumothorax. Nonobstructive bowel gas pattern. No bowel dilatation, bowel wall thickening, or free air. Opacity in pelvis and flanks likely reflects mild ascites. Osseous structures unremarkable. IMPRESSION: Decreased bibasilar atelectasis. Nonobstructive bowel gas pattern. Probable ascites. Electronically Signed   By: Lavonia Dana M.D.   On: 09/12/2020 11:23   DG Abdomen Acute W/Chest  Result Date: 09/05/2020 CLINICAL DATA:  Nausea, vomiting, loss of appetite and abdominal distension developing over the past few days. EXAM: DG ABDOMEN ACUTE WITH 1 VIEW CHEST COMPARISON:  PA and lateral chest 04/23/2028. CT abdomen and pelvis 08/21/2020. FINDINGS: Single-view of the chest demonstrates subsegmental atelectasis in the lower lung zones. No pneumothorax or pleural fluid. Heart size is normal. Aortic atherosclerosis. Two views of the abdomen show no free intraperitoneal air. The bowel gas pattern is  nonobstructive. No acute or focal bony abnormality. IMPRESSION: No acute abnormality chest or abdomen. Electronically Signed   By: Inge Rise M.D.   On: 09/05/2020 11:26   IR IMAGING GUIDED PORT INSERTION  Result Date: 09/07/2020 INDICATION: 65 year old with a pancreatic mass and peritoneal disease. Port-A-Cath needed for chemotherapy. EXAM: FLUOROSCOPIC AND ULTRASOUND GUIDED PLACEMENT OF A SUBCUTANEOUS PORT COMPARISON:  None. MEDICATIONS: Patient is receiving antibiotics as inpatient and additional antibiotics were not given. ANESTHESIA/SEDATION: Versed 3 mg IV; Fentanyl 100 mcg IV; Moderate Sedation Time:  1 hour and 29 minutes The patient was continuously  monitored during the procedure by the interventional radiology nurse under my direct supervision. FLUOROSCOPY TIME:  1 minutes, 30 seconds, 8 mGy CONTRAST:  10 mL Omnipaque 409 COMPLICATIONS: None immediate. PROCEDURE: The procedure, risks, benefits, and alternatives were explained to the patient. Questions regarding the procedure were encouraged and answered. The patient understands and consents to the procedure. Ultrasound-guided paracentesis was performed immediately prior to Port-A-Cath placement. Peritoneal fluid was draining throughout the port placement. Patient was placed supine on the interventional table. Ultrasound confirmed a patent right internal jugular vein. Ultrasound image was saved for documentation. The right chest and neck were cleaned with a skin antiseptic and a sterile drape was placed. Maximal barrier sterile technique was utilized including caps, mask, sterile gowns, sterile gloves, sterile drape, hand hygiene and skin antiseptic. The right neck was anesthetized with 1% lidocaine. Small incision was made in the right neck with a blade. Micropuncture set was placed in the right internal jugular vein with ultrasound guidance. The micropuncture wire was used for measurement purposes. The right chest was anesthetized with 1%  lidocaine with epinephrine. #15 blade was used to make an incision and a subcutaneous port pocket was formed. [place] was assembled. Subcutaneous tunnel was formed with a stiff tunneling device. The port catheter was brought through the subcutaneous tunnel. The port was placed in the subcutaneous pocket. The micropuncture set was exchanged for a peel-away sheath. The catheter was placed through the peel-away sheath and the tip was positioned at the superior cavoatrial junction. Catheter placement was confirmed with fluoroscopy. The port was accessed and flushed with saline. The port pocket was closed using two layers of absorbable sutures and Dermabond. The vein skin site was closed using a single layer of absorbable suture and Dermabond. The port was accessed again. Unfortunately, the port would no longer aspirate. A new access needle was tried but again the port was not aspirating. Contrast injection confirmed that the catheter was well positioned at the superior cavoatrial junction. Fluoroscopic evaluation suggested that the port was intermittently flipping up against the vein wall. Port function was not acceptable, therefore, the Dermabond was removed over the port insertion. The incision was opened up. The port was pulled out of the pocket. Even after some repositioning, the port was consistently flipping up against the vein wall. Therefore, the port was taken apart and the catheter was completely removed over a stiff Glidewire. A new port catheter was advanced over the wire and positioned in the right atrium. The catheter was aspirating well within the right atrium. Therefore, the port was reassembled. Port was placed back in the pocket. Pocket was closed using 2 layers of absorbable suture and Dermabond. Port was accessed again and found to aspirate and flush well. The port was flushed with normal saline. FINDINGS: Initial placement of the Port-A-Cath was at the superior cavoatrial junction. The  catheter was not aspirating well at the superior cavoatrial junction. As a result, a new port was placed and positioned in the right atrium. The port aspirates and flushes well in the right atrium. IMPRESSION: Placement of a subcutaneous port device. Electronically Signed   By: Markus Daft M.D.   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 Patient complains of abdominal pain and distension. EXAM: ULTRASOUND GUIDED  PARACENTESIS MEDICATIONS: None. COMPLICATIONS: None immediate. PROCEDURE: Informed written consent was obtained from the patient after a discussion of the risks, benefits and alternatives to  treatment. A timeout was performed prior to the initiation of the procedure. Initial ultrasound scanning demonstrates a large amount of ascites within the right lower abdominal quadrant. The right lower abdomen was prepped and draped in the usual sterile fashion. 1% lidocaine was used for local anesthesia. Following this, a 6 Fr Safe-T-Centesis catheter was introduced. An ultrasound image was saved for documentation purposes. The paracentesis was performed. The catheter was removed and a dressing was applied. The patient tolerated the procedure well without immediate post procedural complication. Patient received post-procedure intravenous albumin; see nursing notes for details. FINDINGS: A total of approximately 2.9 L of red fluid was removed. IMPRESSION: Successful ultrasound-guided paracentesis yielding 2.9 liters of peritoneal fluid. Electronically Signed   By: Markus Daft M.D.   On: 09/07/2020 18:06    Microbiology Recent Results (from the past 240 hour(s))  Resp Panel by RT-PCR (Flu A&B, Covid) Nasopharyngeal Swab     Status: None   Collection Time: 09/18/20  6:30 AM   Specimen: Nasopharyngeal Swab; Nasopharyngeal(NP) swabs in vial transport medium  Result Value Ref Range Status   SARS Coronavirus 2 by  RT PCR NEGATIVE NEGATIVE Final    Comment: (NOTE) SARS-CoV-2 target nucleic acids are NOT DETECTED.  The SARS-CoV-2 RNA is generally detectable in upper respiratory specimens during the acute phase of infection. The lowest concentration of SARS-CoV-2 viral copies this assay can detect is 138 copies/mL. A negative result does not preclude SARS-Cov-2 infection and should not be used as the sole basis for treatment or other patient management decisions. A negative result may occur with  improper specimen collection/handling, submission of specimen other than nasopharyngeal swab, presence of viral mutation(s) within the areas targeted by this assay, and inadequate number of viral copies(<138 copies/mL). A negative result must be combined with clinical observations, patient history, and epidemiological information. The expected result is Negative.  Fact Sheet for Patients:  EntrepreneurPulse.com.au  Fact Sheet for Healthcare Providers:  IncredibleEmployment.be  This test is no t yet approved or cleared by the [place] FDA and  has been authorized for detection and/or diagnosis of SARS-CoV-2 by FDA under an Emergency Use Authorization (EUA). This EUA will remain  in effect (meaning this test can be used) for the duration of the COVID-19 declaration under Section 564(b)(1) of the Act, 21 U.S.C.section 360bbb-3(b)(1), unless the authorization is terminated  or revoked sooner.       Influenza A by PCR NEGATIVE NEGATIVE Final   Influenza B by PCR NEGATIVE NEGATIVE Final    Comment: (NOTE) The Xpert Xpress SARS-CoV-2/FLU/RSV plus assay is intended as an aid in the diagnosis of influenza from Nasopharyngeal swab specimens and should not be used as a sole basis for treatment. Nasal washings and aspirates are unacceptable for Xpert Xpress SARS-CoV-2/FLU/RSV testing.  Fact Sheet for Patients: EntrepreneurPulse.com.au  Fact Sheet  for Healthcare Providers: IncredibleEmployment.be  This test is not yet approved or cleared by the [place] FDA and has been authorized for detection and/or diagnosis of SARS-CoV-2 by FDA under an Emergency Use Authorization (EUA). This EUA will remain in effect (meaning this test can be used) for the duration of the COVID-19 declaration under Section 564(b)(1) of the Act, 21 U.S.C. section 360bbb-3(b)(1), unless the authorization is terminated or revoked.  Performed at [facility], [place] [address] [place], [place] [address]     Lab Basic Metabolic Panel: Recent Labs  Lab 09/17/20 2307 2020-09-26 0509  NA 124* 126*  K 6.1* >7.5*  CL 91* 95*  CO2 22 12*  GLUCOSE 156* 126*  BUN 36* 66*  CREATININE 1.16 3.06*  CALCIUM 8.4* 7.8*   Liver Function Tests: Recent Labs  Lab 09/17/20 2307  AST 24  ALT 19  ALKPHOS 55  BILITOT 0.7  PROT 6.4*  ALBUMIN 2.5*   Recent Labs  Lab 09/17/20 2307  LIPASE 47   No results for input(s): AMMONIA in the last 168 hours. CBC: Recent Labs  Lab 09/17/20 2307  WBC 11.1*  HGB 12.5*  HCT 37.1*  MCV 91.6  PLT 490*   Cardiac Enzymes: No results for input(s): CKTOTAL, CKMB, CKMBINDEX, TROPONINI in the last 168 hours. Sepsis Labs: Recent Labs  Lab 09/17/20 2307  WBC 11.1*    Procedures/Operations     Cordelia Poche 09/20/2020, 3:37 PM

## 2020-10-02 NOTE — Progress Notes (Signed)
IP PROGRESS NOTE  Subjective:   [name] reports feeling comfortable today with pain is manageable.  Reports some nausea.  Overall he is quite debilitated and not eating much.  Objective:  Vital signs in last 24 hours: Temp:  [97.6 F (36.4 C)-98.7 F (37.1 C)] 97.7 F (36.5 C) (01/19 0441) Pulse Rate:  [93-124] 113 (01/19 0441) Resp:  [12-19] 15 (01/19 0441) BP: (74-118)/(47-80) 84/56 (01/19 0441) SpO2:  [96 %-99 %] 99 % (01/19 0441) Weight change:     Intake/Output from previous day: 01/18 0701 - 01/19 0700 In: 530 [P.O.:30; IV Piggyback:500] Out: 0     General appearance: Comfortable appearing chronically ill. Head: Normocephalic without any trauma Oropharynx: Mucous membranes are moist and pink without any thrush or ulcers. Eyes: Pupils are equal and round reactive to light. Lymph nodes: No cervical, supraclavicular, inguinal or axillary lymphadenopathy.   Heart:regular rate and rhythm.  S1 and S2 without leg edema. Lung: Clear without any rhonchi or wheezes.  No dullness to percussion. Abdomin:  Slightly distended with ascites noted.  Still soft however. Musculoskeletal: No joint deformity or effusion.  Full range of motion noted. Neurological: No deficits noted on motor, sensory and deep tendon reflex exam. Skin: No petechial rash or dryness.  Appeared moist.  Psychiatric: Mood and affect appeared appropriate.   Lab Results: Recent Labs    09/17/20 2307  WBC 11.1*  HGB 12.5*  HCT 37.1*  PLT 490*    BMET Recent Labs    09/17/20 2307 28-Sep-2020 0509  NA 124* 126*  K 6.1* >7.5*  CL 91* 95*  CO2 22 12*  GLUCOSE 156* 126*  BUN 36* 66*  CREATININE 1.16 3.06*  CALCIUM 8.4* 7.8*    Studies/Results: CT ABDOMEN PELVIS W CONTRAST  Result Date: 09/18/2020 CLINICAL DATA:  Right upper quadrant abdominal pain and abdominal distension, metastatic pancreatic carcinoma. Rectal bleeding. Prior rectal fistula repair. Multiple recent paracentesis, most recently 4  days ago. EXAM: CT ABDOMEN AND PELVIS WITH CONTRAST TECHNIQUE: Multidetector CT imaging of the abdomen and pelvis was performed using the standard protocol following bolus administration of intravenous contrast. CONTRAST:  139mL OMNIPAQUE IOHEXOL 300 MG/ML  SOLN COMPARISON:  08/21/2020 FINDINGS: Lower chest: Linear subsegmental atelectasis in the lingula. Faint mosaic attenuation in the bases of the lower lobes, possibly from air trapping. A central catheter tip terminates at the lower cavoatrial junction. A lower paraesophageal lymph node measures 1.5 cm in short axis on image 5 of series 2, previously 1.6 cm. There is a suggestion of a small hiatal hernia potentially with tumor from the peripancreatic region extending up through the hiatus. Hepatobiliary: Newly appreciable 1.4 by 1.6 cm hypodense lesion posteriorly in the right hepatic lobe on image 17 of series 2, concerning for metastatic disease. Gallbladder unremarkable. The previous small foci of right portal vein thrombus shown on 08/21/2020 have resolved. Pancreas: Rapidly enlarging pancreatic mass extending along the lesser curvature of the stomach and also extending posteriorly adjacent to the duodenum. The anterior component measures approximately 6.6 by 8.0 by 6.7 cm and is of higher density, as measured on image 20 of series 2. The component in the pancreatic head and body measures 6.5 by 4.2 by 5.6 cm on image 28 of series 2, and formerly measured about 4.8 by 2.7 by 4.5 cm. A third area of tumor along the posterior margin of the pancreas in fell of the SMA and measures 5.0 by 3.9 by 3.5 cm. All these elements of substantially enlarged compared to 08/21/2020.  Spleen: Unremarkable Adrenals/Urinary Tract: Unremarkable Stomach/Bowel: The anterior component of tumor along the lesser sac is intimately associated with lesser curvature of the stomach and invasion of the gastric wall is not readily excluded. The duodenum is displaced by tumor. Scattered  diverticula of the descending colon. Vascular/Lymphatic: Aortoiliac atherosclerotic vascular disease. 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 There some collateral vessels around the spleen and stomach. Indistinctly marginated porta hepatis adenopathy substantially worsened from 08/21/2014. Portacaval node 2.3 cm in short axis on image 27 of series 2. Reproductive: Unremarkable Other: Extensive and rapid progression of tumor caking in the omentum and along peritoneal surfaces especially in the pelvis where the tumor rind is particularly thick informs masslike components measuring 4.5 cm in thickness on the left and 3.0 cm in thickness on the right on image 69 of series 2. Malignant ascites noted, moderate in amount. Fluid infiltration of the mesentery diffusely. Musculoskeletal: The known subacute fracture the right anterior seventh rib is not included on today's images. IMPRESSION: 1. Extensive and rapid progression of tumor caking in the omentum and along peritoneal surfaces especially in the pelvis, with moderate malignant ascites. 2. Rapidly enlarging pancreatic and peripancreatic masses. There is also a newly appreciable 1.4 by 1.6 cm hypodense lesion posteriorly in the right hepatic lobe concerning for metastatic disease. 3. The previous small foci of right portal vein thrombus shown on 08/21/2020 have resolved. 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 4. There is a suggestion of a small hiatal hernia potentially with tumor extending up through the hiatus. Adjacent lower paraesophageal adenopathy. 5. Faint mosaic attenuation in the bases of the lower lobes, possibly from air trapping. 6. Aortic atherosclerosis. Aortic Atherosclerosis (ICD10-I70.0). Electronically Signed   By: Van Clines M.D.   On: 09/18/2020 08:32   US  Paracentesis  Result Date: 09/18/2020 INDICATION: Patient with history of pancreatic cancer with recurrent malignant ascites. Request for therapeutic paracentesis. EXAM: ULTRASOUND GUIDED RIGHT LOWER QUADRANT PARACENTESIS MEDICATIONS: 1% plain lidocaine, 5 mL COMPLICATIONS: None immediate. PROCEDURE: Informed written consent was obtained from the patient after a discussion of the risks, benefits and alternatives to treatment. A timeout was performed prior to the initiation of the procedure. Initial ultrasound scanning demonstrates a moderate to large amount of ascites within the right lower abdominal quadrant. The right lower abdomen was prepped and draped in the usual sterile fashion. 1% lidocaine was used for local anesthesia. Following this, a 19 gauge, 7-cm, Yueh catheter was introduced. An ultrasound image was saved for documentation purposes. The paracentesis was performed. The catheter was removed and a dressing was applied. The patient tolerated the procedure well without immediate post procedural complication. FINDINGS: A total of approximately 3 L of thin bloody ascitic fluid was removed. IMPRESSION: Successful ultrasound-guided paracentesis yielding 3 liters of peritoneal fluid. Read by: Ascencion Dike PA-C Electronically Signed   By: Markus Daft M.D.   On: 09/18/2020 10:58    Medications: I have reviewed the patient's current medications.  Assessment/Plan:  65 year old with:  1.  End-stage pancreatic cancer after presenting with advanced disease and rapid progression based on imaging studies.  Systemic chemotherapy will offer very little to no palliation at this point given his overall debilitated status.  I agree with supportive management only and transitioning into comfort care.  2.  Ascites: Related to his malignancy.  He has been receiving recurrent paracentesis.  I  have requested evaluation by interventional radiology for a possible indwelling catheter for recurrent drainage.  3.   Acute renal failure and metabolic derangement: Related to dehydration as well as recurrent paracentesis and intravascular volume depletion.  He is receiving IV hydration although he would not be a candidate for any additional therapy.  4.  Prognosis: Poor with limited life expectancy likelihood and any related weeks.  He would be a hospice candidate without being on home hospice or residential hospice.  This was discussed with the patient and his wife today.   35  minutes were dedicated to this visit. The time was spent on reviewing laboratory data, imaging studies, discussing treatment options, and answering questions regarding future plan.    LOS: 0 days   Zola Button 10-17-2020, 7:33 AM

## 2020-10-02 NOTE — Consult Note (Signed)
Consultation Note Date: 09/28/20   Patient Name: Jesse Frye  DOB: 1955/10/21  MRN: [id number]  Age / Sex: 65 y.o., male  PCP: Patient, No Pcp Per Referring Physician: Mariel Aloe, MD  Reason for Consultation: Establishing goals of care  HPI/Patient Profile: 65 y.o. male  admitted on 09/18/2020    Clinical Assessment and Goals of Care: 65 year old gentleman who lives at home with his wife and family, life limiting illness of end-stage pancreatic cancer presented with advanced disease found to have rapid progression based on imaging studies.  Has ascites that is related to his malignancy Hospital course also complicated by acute renal failure and metabolic derangements.  Palliative consult for goals of care discussions.  Patient is awake alert resting in bed.  He is able to converse some in [place], wife is present at the bedside.  Introduced myself and palliative care as follows:  Palliative medicine is specialized medical care for people living with serious illness. It focuses on providing relief from the symptoms and stress of a serious illness. The goal is to improve quality of life for both the patient and the family.  Goals of care: Broad aims of medical therapy in relation to the patient's values and preferences. Our aim is to provide medical care aimed at enabling patients to achieve the goals that matter most to them, given the circumstances of their particular medical situation and their constraints.   Extended family members also arrived at the bedside, patient and wife are in complete understanding of the patient's current situation, they state that they are awaiting further discussions with regards to appropriate hospice agency.  Discussed briefly about hospice philosophy of care.  NEXT OF KIN Wife and daughter  SUMMARY OF RECOMMENDATIONS   Agree with DO NOT RESUSCITATE Home with  hospice  Code Status/Advance Care Planning:  DNR    Symptom Management:      Palliative Prophylaxis:   Delirium Protocol  Additional Recommendations (Limitations, Scope, Preferences):  Full Comfort Care  Psycho-social/Spiritual:   Desire for further Chaplaincy support:yes  Additional Recommendations: Education on Hospice  Prognosis:   < 3 months  Discharge Planning: Home with Hospice      Primary Diagnoses: Present on Admission: . Ascites . Malignant neoplasm of pancreas ([place]) . Generalized abdominal pain . Hyponatremia . Hyperkalemia   I have reviewed the medical record, interviewed the patient and family, and examined the patient. The following aspects are pertinent.  Past Medical History:  Diagnosis Date  . Abdominal pain on and off for 2 months  . Abdominal swelling   . Cancer [facility])    metastatic pancreatic cancer  . History of rectal bleeding    for 1 week  . Hyperlipidemia    no meds   Social History   Socioeconomic History  . Marital status: Married    Spouse name: Not on file  . Number of children: Not on file  . Years of education: Not on file  . Highest education level: Not on file  Occupational History  .  Not on file  Tobacco Use  . Smoking status: Former Smoker    Types: Cigarettes    Quit date: 08/29/2014    Years since quitting: 6.0  . Smokeless tobacco: Never Used  . Tobacco comment: Pt used to smoke 4-6 cigarettes day but has now quit as of 7 years ago  Substance and Sexual Activity  . Alcohol use: No  . Drug use: No  . Sexual activity: Not on file  Other Topics Concern  . Not on file  Social History Narrative  . Not on file   Social Determinants of Health   Financial Resource Strain: Not on file  Food Insecurity: Not on file  Transportation Needs: Not on file  Physical Activity: Not on file  Stress: Not on file  Social Connections: Not on file   Family History  Problem Relation Age of Onset  . Esophageal  cancer Father    Scheduled Meds: . enoxaparin (LOVENOX) injection  30 mg Subcutaneous Daily  .  morphine injection  4 mg Intravenous Q4H  . pantoprazole (PROTONIX) IV  40 mg Intravenous Q24H  . senna-docusate  1 tablet Oral BID  . sodium zirconium cyclosilicate  10 g Oral Once  . sucralfate  1 g Oral TID WC & HS   Continuous Infusions: . sodium chloride    . feeding supplement (OSMOLITE 1.2 CAL)     PRN Meds:.lidocaine-prilocaine, LORazepam, ondansetron (ZOFRAN) IV, ondansetron, polyethylene glycol, promethazine Medications Prior to Admission:  Prior to Admission medications   Medication Sig Start Date End Date Taking? Authorizing Provider  ondansetron (ZOFRAN) 4 MG tablet Take 1 tablet (4 mg total) by mouth every 6 (six) hours as needed for nausea. 09/14/20  Yes Wyatt Portela, MD  oxyCODONE (OXY IR/ROXICODONE) 5 MG immediate release tablet Take 1 tablet (5 mg total) by mouth every 4 (four) hours as needed for breakthrough pain. 09/10/20  Yes Shelly Coss, MD  polyethylene glycol (MIRALAX / GLYCOLAX) 17 g packet Take 17 g by mouth daily as needed. Patient taking differently: Take 17 g by mouth daily as needed for moderate constipation. 09/10/20  Yes Adhikari, Tamsen Meek, MD  senna-docusate (SENOKOT-S) 8.6-50 MG tablet Take 1 tablet by mouth 2 (two) times daily. 09/10/20  Yes Shelly Coss, MD  lidocaine-prilocaine (EMLA) cream Apply 1 application topically as needed. 09/14/20   Wyatt Portela, MD   Allergies  Allergen Reactions  . Penicillins Shortness Of Breath and Other (See Comments)    Childhood reaction Other reaction(s): Dizziness Tolerated amoxicillin in 2016 and Augmentin in 2018   Review of Systems +complains of feeling "nervous"  Physical Exam Patient appears chronically ill Awake alert resting in bed Has abdominal distention with ascites Regular work of breathing Has muscle wasting No edema No focal deficits  Vital Signs: BP (!) 78/31 (BP Location: Left Arm)    Pulse (!) 110   Temp 98 F (36.7 C)   Resp 16   SpO2 98%  Pain Scale: 0-10   Pain Score: 2    SpO2: SpO2: 98 % O2 Device:SpO2: 98 % O2 Flow Rate: .   IO: Intake/output summary:   Intake/Output Summary (Last 24 hours) at 2020-10-10 1149 Last data filed at 10-Oct-2020 1000 Gross per 24 hour  Intake 590 ml  Output 0 ml  Net 590 ml    LBM:   Baseline Weight:   Most recent weight:       Palliative Assessment/Data:   PPS 40%  Time In:  10 Time Out:  11  Time Total:  60  Greater than 50%  of this time was spent counseling and coordinating care related to the above assessment and plan.  Signed by: Loistine Chance, MD   Please contact Palliative Medicine Team phone at [phone] for questions and concerns.  For individual provider: See Shea Evans

## 2020-10-02 NOTE — TOC Initial Note (Addendum)
Transition of Care (TOC) - Initial/Assessment Note    Patient Details  Name: Jesse Frye MRN: [id number] Date of Birth: 08/03/1956  Transition of Care (TOC) CM/SW Contact:    Nicole A Sinclair, LCSW Phone Number: 09/06/2020, 10:22 AM  Clinical Narrative:                 CSW met with the patient and his spouse at bedside to discuss home hospice services. CSW used interpreter Juan #[id number].  Patient resting. Spouse prefers to use hospice services in [place]. CSW explain [facility] services and process to initiate services. CSW inquired about DME, spouse declines hospital bed or bedside commode. She reports she can get a bedside commode for the patient.   CSW reached out to [facility] liaison, left voicemail. CSW waiting for a return call.   Addendum-Sherri w/[facility] will follow up daughter Mariela Lunez-[phone] per spouse and pt. request  Expected Discharge Plan: Home/Self Care     Patient Goals and CMS Choice        Expected Discharge Plan and Services Expected Discharge Plan: Home/Self Care In-house Referral: Hospice / Palliative Care,Clinical Social Work   Post Acute Care Choice: Hospice Living arrangements for the past 2 months: Single Family Home Expected Discharge Date:  (unknown)                                    Prior Living Arrangements/Services Living arrangements for the past 2 months: Single Family Home Lives with:: Spouse,Adult Children Patient language and need for interpreter reviewed:: No Do you feel safe going back to the place where you live?: Yes      Need for Family Participation in Patient Care: Yes (Comment) Care giver support system in place?: Yes (comment)   Criminal Activity/Legal Involvement Pertinent to Current Situation/Hospitalization: No - Comment as needed  Activities of Daily Living Home Assistive Devices/Equipment: None ADL Screening (condition at time of admission) Patient's cognitive ability  adequate to safely complete daily activities?: Yes Is the patient deaf or have difficulty hearing?: No Does the patient have difficulty seeing, even when wearing glasses/contacts?: No Does the patient have difficulty concentrating, remembering, or making decisions?: No Patient able to express need for assistance with ADLs?: Yes Does the patient have difficulty dressing or bathing?: No Independently performs ADLs?: Yes (appropriate for developmental age) Does the patient have difficulty walking or climbing stairs?: No Weakness of Legs: None Weakness of Arms/Hands: None  Permission Sought/Granted Permission sought to share information with : Family Supports Permission granted to share information with : Yes, Verbal Permission Granted     Permission granted to share info w AGENCY: [facility]        Emotional Assessment Appearance:: Appears stated age   Affect (typically observed): Quiet Orientation: : Oriented to Self Alcohol / Substance Use: Not Applicable Psych Involvement: No (comment)  Admission diagnosis:  Hyponatremia [E87.1] Ascites, malignant [R18.0] Pancreatic cancer (HCC) [C25.9] Generalized abdominal pain [R10.84] Malignant ascites [R18.0] Ascites [R18.8] Nausea and vomiting, intractability of vomiting not specified, unspecified vomiting type [R11.2] Patient Active Problem List   Diagnosis Date Noted  . Hyponatremia 09/15/2020  . Hyperkalemia 09/02/2020  . Goals of care, counseling/discussion 09/10/2020  . Ascites   . Malignant neoplasm of pancreas (HCC)   . Generalized abdominal pain   . Intractable nausea and vomiting 09/05/2020   PCP:  Patient, No Pcp Per Pharmacy:   [id number] -   [place], [place] [place] [address] Phone: [phone] Fax: [phone]     Social Determinants of Health (SDOH) Interventions    Readmission Risk Interventions No flowsheet data found.

## 2020-10-02 NOTE — Progress Notes (Signed)
Per lab, patient has elevated potassium level of 7.5. MD on call notified via paging system.

## 2020-10-02 NOTE — Progress Notes (Signed)
Nutrition Brief Note  RD consulted for tube feeding initiation and management.  Chart reviewed. Pt now transitioning to comfort care.  No further nutrition interventions warranted at this time.    Clayton Bibles, MS, RD, LDN Inpatient Clinical Dietitian Contact information available via Amion

## 2020-10-02 NOTE — Discharge Summary (Incomplete)
Physician Discharge Summary  Jesse Frye ZOX:[id number] DOB: Jan 16, 1956 DOA: 09/18/2020  PCP: Patient, No Pcp Per  Admit date: 09/18/2020 Discharge date: 2020-10-04  Admitted From: *** Disposition: ***  Recommendations for Outpatient Follow-up:  1. Follow up with PCP in 1 week*** 2. Please obtain BMP/CBC in one week*** 3. Please follow up on the following pending results: ***  Home Health: *** Equipment/Devices: ***  Discharge Condition: *** CODE STATUS: *** Diet recommendation: ***   Brief/Interim Summary:  Admission HPI written by Jacques Navy, MD   [facility] course:  ***  Discharge Diagnoses:  Active Problems:   Ascites   Malignant neoplasm of pancreas (HCC)   Generalized abdominal pain   Hyponatremia   Hyperkalemia   Hospice care patient    Discharge Instructions   Allergies as of 10-04-20      Reactions   Penicillins Shortness Of Breath, Other (See Comments)   Childhood reaction Other reaction(s): Dizziness Tolerated amoxicillin in 2016 and Augmentin in 2018      Medication List    STOP taking these medications   lidocaine-prilocaine cream Commonly known as: EMLA   ondansetron 4 MG tablet Commonly known as: ZOFRAN   oxyCODONE 5 MG immediate release tablet Commonly known as: Oxy IR/ROXICODONE   polyethylene glycol 17 g packet Commonly known as: MIRALAX / GLYCOLAX   senna-docusate 8.6-50 MG tablet Commonly known as: Senokot-S     TAKE these medications   LORazepam 0.5 MG tablet Commonly known as: ATIVAN Take 1 tablet (0.5 mg total) by mouth every 4 (four) hours as needed for anxiety. May crush, mix with water and give sublingually if needed.   morphine 20 MG/ML concentrated solution Commonly known as: ROXANOL Take 0.5 mLs (10 mg total) by mouth every 4 (four) hours as needed for severe pain or shortness of breath. May give sublingually if needed.   ondansetron 8 MG disintegrating tablet Commonly known as:  Zofran ODT Take 1 tablet (8 mg total) by mouth every 8 (eight) hours as needed for nausea or vomiting.       Allergies  Allergen Reactions  . Penicillins Shortness Of Breath and Other (See Comments)    Childhood reaction Other reaction(s): Dizziness Tolerated amoxicillin in 2016 and Augmentin in 2018    Consultations:  ***   Procedures/Studies: CT CHEST W CONTRAST  Result Date: 09/07/2020 CLINICAL DATA:  Pancreatic carcinoma. EXAM: CT CHEST WITH CONTRAST TECHNIQUE: Multidetector CT imaging of the chest was performed during intravenous contrast administration. CONTRAST:  37mL OMNIPAQUE IOHEXOL 300 MG/ML  SOLN COMPARISON:  Abdomen CT on 08/21/2020; no prior chest CT FINDINGS: Cardiovascular: No acute findings. Aortic atherosclerotic calcification noted. Mediastinum/Nodes: No masses or pathologically enlarged lymph nodes identified. Lungs/Pleura: Linear opacity in both lower lobes is consistent with subsegmental atelectasis. No evidence of pulmonary infiltrate or pleural effusion. A 3 mm pulmonary nodule is seen in the medial right upper lobe on image 33/5, and a 3 mm nodule is seen in the left lung apex on image 18/5, which are nonspecific but relatively unlikely to represent metastatic disease. Upper Abdomen: Large pancreatic soft tissue mass in the central upper abdomen has increased since previous study. There is new perihepatic ascites and diffuse omental soft tissue stranding, consistent with worsening peritoneal carcinomatosis. Musculoskeletal:  No suspicious bone lesions. IMPRESSION: No definite metastatic disease within the thorax. 2 tiny bilateral upper lobe pulmonary nodules are nonspecific, but unlikely represent metastatic disease. Recommend continued attention on follow-up imaging. Bilateral lower lobe subsegmental  atelectasis. Increased size of large pancreatic soft tissue mass and worsening peritoneal carcinomatosis and perihepatic ascites. Aortic Atherosclerosis (ICD10-I70.0).  Electronically Signed   By: Marlaine Hind M.D.   On: 09/07/2020 19:40   CT ABDOMEN PELVIS W CONTRAST  Result Date: 09/18/2020 CLINICAL DATA:  Right upper quadrant abdominal pain and abdominal distension, metastatic pancreatic carcinoma. Rectal bleeding. Prior rectal fistula repair. Multiple recent paracentesis, most recently 4 days ago. EXAM: CT ABDOMEN AND PELVIS WITH CONTRAST TECHNIQUE: Multidetector CT imaging of the abdomen and pelvis was performed using the standard protocol following bolus administration of intravenous contrast. CONTRAST:  18mL OMNIPAQUE IOHEXOL 300 MG/ML  SOLN COMPARISON:  08/21/2020 FINDINGS: Lower chest: Linear subsegmental atelectasis in the lingula. Faint mosaic attenuation in the bases of the lower lobes, possibly from air trapping. A central catheter tip terminates at the lower cavoatrial junction. A lower paraesophageal lymph node measures 1.5 cm in short axis on image 5 of series 2, previously 1.6 cm. There is a suggestion of a small hiatal hernia potentially with tumor from the peripancreatic region extending up through the hiatus. Hepatobiliary: Newly appreciable 1.4 by 1.6 cm hypodense lesion posteriorly in the right hepatic lobe on image 17 of series 2, concerning for metastatic disease. Gallbladder unremarkable. The previous small foci of right portal vein thrombus shown on 08/21/2020 have resolved. Pancreas: Rapidly enlarging pancreatic mass extending along the lesser curvature of the stomach and also extending posteriorly adjacent to the duodenum. The anterior component measures approximately 6.6 by 8.0 by 6.7 cm and is of higher density, as measured on image 20 of series 2. The component in the pancreatic head and body measures 6.5 by 4.2 by 5.6 cm on image 28 of series 2, and formerly measured about 4.8 by 2.7 by 4.5 cm. A third area of tumor along the posterior margin of the pancreas in fell of the SMA and measures 5.0 by 3.9 by 3.5 cm. All these elements of  substantially enlarged compared to 08/21/2020. Spleen: Unremarkable Adrenals/Urinary Tract: Unremarkable Stomach/Bowel: The anterior component of tumor along the lesser sac is intimately associated with lesser curvature of the stomach and invasion of the gastric wall is not readily excluded. The duodenum is displaced by tumor. Scattered diverticula of the descending colon. Vascular/Lymphatic: Aortoiliac atherosclerotic vascular disease. 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 There some collateral vessels around the spleen and stomach. Indistinctly marginated porta hepatis adenopathy substantially worsened from 08/21/2014. Portacaval node 2.3 cm in short axis on image 27 of series 2. Reproductive: Unremarkable Other: Extensive and rapid progression of tumor caking in the omentum and along peritoneal surfaces especially in the pelvis where the tumor rind is particularly thick informs masslike components measuring 4.5 cm in thickness on the left and 3.0 cm in thickness on the right on image 69 of series 2. Malignant ascites noted, moderate in amount. Fluid infiltration of the mesentery diffusely. Musculoskeletal: The known subacute fracture the right anterior seventh rib is not included on today's images. IMPRESSION: 1. Extensive and rapid progression of tumor caking in the omentum and along peritoneal surfaces especially in the pelvis, with moderate malignant ascites. 2. Rapidly enlarging pancreatic and peripancreatic masses. There is also a newly appreciable 1.4 by 1.6 cm hypodense lesion posteriorly in the right hepatic lobe concerning for metastatic disease. 3. The previous small foci of right portal vein thrombus shown on 08/21/2020 have resolved. There continues to be occlusion of the SMV, and peripancreatic and  pancreatic tumor surrounds the superior mesenteric artery. 4. There is a suggestion of a small hiatal hernia  potentially with tumor extending up through the hiatus. Adjacent lower paraesophageal adenopathy. 5. Faint mosaic attenuation in the bases of the lower lobes, possibly from air trapping. 6. Aortic atherosclerosis. Aortic Atherosclerosis (ICD10-I70.0). Electronically Signed   By: Van Clines M.D.   On: 09/18/2020 08:32   CT Abdomen Pelvis W Contrast  Addendum Date: 08/21/2020   ADDENDUM REPORT: 08/21/2020 12:12 ADDENDUM: Findings were discussed via telephone at the time of dictation given SMV narrowing suspected superimposed acute thrombus lactate was suggested for correlation to exclude developing venous ischemia though bowel enhancement is preserved and symmetric on today's study. Origin of suspected neoplasm may be upper gastrointestinal versus pancreatic based on the appearance of the gastric antrum which is also inseparable from soft tissue in the lesser sac. These results were called by telephone at the time of interpretation on 08/21/2020 at 12:12 pm to provider [facility] , who verbally acknowledged these results. Electronically Signed   By: Zetta Bills M.D.   On: 08/21/2020 12:12   Result Date: 08/21/2020 CLINICAL DATA:  Acute abdominal pain for 8 days, diffuse abdominal pain EXAM: CT ABDOMEN AND PELVIS WITH CONTRAST TECHNIQUE: Multidetector CT imaging of the abdomen and pelvis was performed using the standard protocol following bolus administration of intravenous contrast. CONTRAST:  143mL OMNIPAQUE IOHEXOL 300 MG/ML  SOLN COMPARISON:  None FINDINGS: Lower chest: No consolidation.  No pleural effusion. Hepatobiliary: Heterogeneous appearance of the liver which in general displays low attenuation. Periportal enhancement and some areas of low attenuation extending into the peripheral liver substance more so in posterior division RIGHT portal distribution than in other areas but also in anterior RIGHT. No discrete hepatic lesion aside from a small hypervascular focus in the RIGHT hepatic  lobe on image 20 of series 3 that measures 7 mm. Suspected transient hepatic attenuation difference along the anterolateral segment of the LEFT hepatic lobe. No pericholecystic stranding. No biliary duct dilation. Portal vein is highly narrowed at the SMV/portal confluence with an area of low attenuation in the lumen compatible with portal thrombus. Pancreas: Large heterogeneous masslike area in the neck and head of the pancreas anteriorly measuring 4.8 x 2.6 cm. No signs of significant pancreatic ductal dilation. Infiltration into the transverse mesocolon, soft tissue and nodularity extending into the transverse mesocolon on image 28 of series 3. Also with soft tissue density extending into the hepatic gastric recess of the lesser sac, this area measuring approximately 4.3 x 4.1 cm. No sign of gas in the lesser sac. Spleen: Normal Adrenals/Urinary Tract: Adrenal glands are normal. Symmetric renal enhancement. No hydronephrosis. Urinary bladder is smooth in terms of contour. No focal abnormality Stomach/Bowel: Perigastric stranding.  Some gastric wall thickening. Small bowel with stranding in the area of the ligament of Treitz. Masslike area with low attenuation just above the fourth portion of the duodenum best seen on coronal image 77 of series 6 measuring approximately 5.5 cm greatest coronal dimension with 2.6 cm greatest thickness in the axial plane. Colon with diverticular disease. Mild mesenteric edema in the LEFT hemiabdomen and small volume ascites in the pelvis. Vascular/Lymphatic: Calcified and noncalcified atheromatous plaque in the abdominal aorta. No aneurysmal dilation. Soft tissue surrounds the SMA at the root of the small bowel mesentery image 27 of series 3. Hazy stranding contact approximately 360 degrees with soft tissue showing subtle low-density fat plane in some areas but more pronounced stranding and soft tissue on  image 28 of series 3 suggests vascular involvement, also associated with  suspected portal invasion. 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 Suspected superimposed bland thrombus in the portal vein just above this area. These are best captured on delayed images 21 through 16 Reproductive: Prostate heterogeneous. The no pelvic lymphadenopathy. Other: No free-air. Infiltration of the omentum with subtle stranding suspicious for peritoneal disease. This is seen along the anterior abdomen best seen on images 31 of series 3 just below the liver and gallbladder fossa. Also associated with ascites in the pelvis, small volume. Musculoskeletal: Subacute fracture of RIGHT seventh rib. No acute bone finding or signs of destructive bone process. IMPRESSION: 1. Heterogeneous masslike area in the head of the pancreas with findings that are suspicious for extensive local tumor involvement, nodal disease and peritoneal disease. 2. 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 3. Mild mesenteric edema versus tumor infiltration in the LEFT upper quadrant. Correlation with lactate may be helpful in this patient with near occlusion of the SMV. Some collateral pathways are present but a small amount of superimposed acute thrombosis seen on today's study could pose a hazard in this patient. 4. Subtle focus of arterial phase enhancement along the hepatic margin raising the question of hepatic metastatic disease. After resolution of acute symptoms would consider liver MR, preferably when the patient is able to cooperate with breath hold instructions, perhaps as an outpatient. 5. Hazy stranding contact about the SMA which on coronal images shows a fat plane but with extensive involvement with soft tissue at the root of the small bowel mesentery and frank invasion of the SMV as described. 6. Subacute fracture of RIGHT seventh rib. 7. Small  volume ascites in the pelvis. 8. Aortic atherosclerosis. A call is out to the referring provider to further discuss findings in the above case. Aortic Atherosclerosis (ICD10-I70.0). Electronically Signed: By: Zetta Bills M.D. On: 08/21/2020 12:00   US Paracentesis  Result Date: 09/18/2020 INDICATION: Patient with history of pancreatic cancer with recurrent malignant ascites. Request for therapeutic paracentesis. EXAM: ULTRASOUND GUIDED RIGHT LOWER QUADRANT PARACENTESIS MEDICATIONS: 1% plain lidocaine, 5 mL COMPLICATIONS: None immediate. PROCEDURE: Informed written consent was obtained from the patient after a discussion of the risks, benefits and alternatives to treatment. A timeout was performed prior to the initiation of the procedure. Initial ultrasound scanning demonstrates a moderate to large amount of ascites within the right lower abdominal quadrant. The right lower abdomen was prepped and draped in the usual sterile fashion. 1% lidocaine was used for local anesthesia. Following this, a 19 gauge, 7-cm, Yueh catheter was introduced. An ultrasound image was saved for documentation purposes. The paracentesis was performed. The catheter was removed and a dressing was applied. The patient tolerated the procedure well without immediate post procedural complication. FINDINGS: A total of approximately 3 L of thin bloody ascitic fluid was removed. IMPRESSION: Successful ultrasound-guided paracentesis yielding 3 liters of peritoneal fluid. Read by: Ascencion Dike PA-C Electronically Signed   By: Markus Daft M.D.   On: 09/18/2020 10:58   US Paracentesis  Result Date: 09/14/2020 INDICATION: Patient with history of pancreatic cancer with recurrent malignant ascites. Request received for therapeutic paracentesis. EXAM: ULTRASOUND GUIDED THERAPEUTIC PARACENTESIS MEDICATIONS: 1% lidocaine to skin and subcutaneous tissue COMPLICATIONS: None immediate. PROCEDURE: Informed written consent was obtained from the patient  via interpreter after a discussion of the risks,  benefits and alternatives to treatment. A timeout was performed prior to the initiation of the procedure. Initial ultrasound scanning demonstrates a moderate amount of ascites within the right upper abdominal quadrant. The right upper abdomen was prepped and draped in the usual sterile fashion. 1% lidocaine was used for local anesthesia. Following this, a 19 gauge, 7-cm, Yueh catheter was introduced. An ultrasound image was saved for documentation purposes. The paracentesis was performed. The catheter was removed and a dressing was applied. The patient tolerated the procedure well without immediate post procedural complication. FINDINGS: A total of approximately 2.9 liters of bloody fluid was removed. IMPRESSION: Successful ultrasound-guided therapeutic paracentesis yielding 2.9 liters of peritoneal fluid. Read by: Rowe Robert, PA-C Electronically Signed   By: Ruthann Cancer MD   On: 09/14/2020 13:37   US Paracentesis  Result Date: 09/12/2020 INDICATION: Patient with history of pancreatic cancer with recurrent malignant ascites. Request received for therapeutic paracentesis. EXAM: ULTRASOUND GUIDED THERAPEUTIC PARACENTESIS MEDICATIONS: 1% lidocaine to skin and subcutaneous tissue COMPLICATIONS: None immediate. PROCEDURE: Informed written consent was obtained from the patient after a discussion of the risks, benefits and alternatives to treatment. A timeout was performed prior to the initiation of the procedure. Initial ultrasound scanning demonstrates a moderate amount of ascites within the right lower abdominal quadrant. The right lower abdomen was prepped and draped in the usual sterile fashion. 1% lidocaine was used for local anesthesia. Following this, a 6 Fr Safe-T-Centesis catheter was introduced. An ultrasound image was saved for documentation purposes. The paracentesis was performed. The catheter was removed and a dressing was applied. The patient  tolerated the procedure well without immediate post procedural complication. FINDINGS: A total of approximately 3.2 liters of turbid, light bloody fluid was removed. IMPRESSION: Successful ultrasound-guided therapeutic paracentesis yielding 3.2 liters of peritoneal fluid. Read by: Rowe Robert, PA-C Electronically Signed   By: Sandi Mariscal M.D.   On: 09/12/2020 16:39   US Paracentesis  Result Date: 09/09/2020 INDICATION: Patient with history of pancreatic mass, recurrent ascites. Request is made for diagnostic and therapeutic paracentesis. EXAM: ULTRASOUND GUIDED DIAGNOSTIC AND THERAPEUTIC PARACENTESIS MEDICATIONS: 10 mL 1% lidocaine COMPLICATIONS: None immediate. PROCEDURE: Informed written consent was obtained from the patient after a discussion of the risks, benefits and alternatives to treatment. A timeout was performed prior to the initiation of the procedure. Initial ultrasound scanning demonstrates a small amount of ascites within the right lower abdominal quadrant. The right lower abdomen was prepped and draped in the usual sterile fashion. 1% lidocaine was used for local anesthesia. Following this, a 19 gauge, 7-cm, Yueh catheter was introduced. An ultrasound image was saved for documentation purposes. The paracentesis was performed. The catheter was removed and a dressing was applied. The patient tolerated the procedure well without immediate post procedural complication. FINDINGS: A total of approximately 2.0 liters of bloody fluid was removed. Samples were sent to the laboratory as requested by the clinical team. IMPRESSION: Successful ultrasound-guided paracentesis yielding 2.0 liters of peritoneal fluid. Read by: Brynda Greathouse PA-C Electronically Signed   By: Jerilynn Mages.  Shick M.D.   On: 09/09/2020 11:50   US Paracentesis  Result Date: 09/05/2020 INDICATION: Patient with history of abdominal pain, nausea, vomiting, abdominal distension/ascites, pancreatic mass; request received for diagnostic and  therapeutic paracentesis. EXAM: ULTRASOUND GUIDED DIAGNOSTIC AND THERAPEUTIC PARACENTESIS MEDICATIONS: 1% lidocaine to skin and subcutaneous tissue COMPLICATIONS: None immediate. PROCEDURE: Informed written consent was obtained from the patient after a discussion of the risks, benefits and alternatives to treatment. A timeout was performed prior  to the initiation of the procedure. Initial ultrasound scanning demonstrates a large amount of ascites within the right lower abdominal quadrant. The right lower abdomen was prepped and draped in the usual sterile fashion. 1% lidocaine was used for local anesthesia. Following this, a 19 gauge, 10-cm, Yueh catheter was introduced. An ultrasound image was saved for documentation purposes. The paracentesis was performed. The catheter was removed and a dressing was applied. The patient tolerated the procedure well without immediate post procedural complication. FINDINGS: A total of approximately 6.1 liters of hazy, slightly blood tinged/amber fluid was removed. Samples were sent to the laboratory as requested by the clinical team. IMPRESSION: Successful ultrasound-guided diagnostic and therapeutic paracentesis yielding 6.1 liters of peritoneal fluid. Read by: Rowe Robert, PA-C Electronically Signed   By: Aletta Edouard M.D.   On: 09/05/2020 16:21   DG ABD ACUTE 2+V W 1V CHEST  Result Date: 09/12/2020 CLINICAL DATA:  Abdominal pain, history of ascites and paracentesis, pancreatic neoplasm EXAM: DG ABDOMEN ACUTE WITH 1 VIEW CHEST COMPARISON:  09/05/2020 FINDINGS: RIGHT subclavian Port-A-Cath with tip projecting over cavoatrial junction, new. Normal heart size, mediastinal contours, and pulmonary vascularity. Atherosclerotic calcification aorta. Decreased basilar atelectasis and accentuation of perihilar markings. No acute infiltrate, pleural effusion or pneumothorax. Nonobstructive bowel gas pattern. No bowel dilatation, bowel wall thickening, or free air. Opacity in pelvis  and flanks likely reflects mild ascites. Osseous structures unremarkable. IMPRESSION: Decreased bibasilar atelectasis. Nonobstructive bowel gas pattern. Probable ascites. Electronically Signed   By: Lavonia Dana M.D.   On: 09/12/2020 11:23   DG Abdomen Acute W/Chest  Result Date: 09/05/2020 CLINICAL DATA:  Nausea, vomiting, loss of appetite and abdominal distension developing over the past few days. EXAM: DG ABDOMEN ACUTE WITH 1 VIEW CHEST COMPARISON:  PA and lateral chest 04/23/2028. CT abdomen and pelvis 08/21/2020. FINDINGS: Single-view of the chest demonstrates subsegmental atelectasis in the lower lung zones. No pneumothorax or pleural fluid. Heart size is normal. Aortic atherosclerosis. Two views of the abdomen show no free intraperitoneal air. The bowel gas pattern is nonobstructive. No acute or focal bony abnormality. IMPRESSION: No acute abnormality chest or abdomen. Electronically Signed   By: Inge Rise M.D.   On: 09/05/2020 11:26   IR IMAGING GUIDED PORT INSERTION  Result Date: 09/07/2020 INDICATION: 65 year old with a pancreatic mass and peritoneal disease. Port-A-Cath needed for chemotherapy. EXAM: FLUOROSCOPIC AND ULTRASOUND GUIDED PLACEMENT OF A SUBCUTANEOUS PORT COMPARISON:  None. MEDICATIONS: Patient is receiving antibiotics as inpatient and additional antibiotics were not given. ANESTHESIA/SEDATION: Versed 3 mg IV; Fentanyl 100 mcg IV; Moderate Sedation Time:  1 hour and 29 minutes The patient was continuously monitored during the procedure by the interventional radiology nurse under my direct supervision. FLUOROSCOPY TIME:  1 minutes, 30 seconds, 8 mGy CONTRAST:  10 mL Omnipaque [id number] COMPLICATIONS: None immediate. PROCEDURE: The procedure, risks, benefits, and alternatives were explained to the patient. Questions regarding the procedure were encouraged and answered. The patient understands and consents to the procedure. Ultrasound-guided paracentesis was performed immediately prior to  Port-A-Cath placement. Peritoneal fluid was draining throughout the port placement. Patient was placed supine on the interventional table. Ultrasound confirmed a patent right internal jugular vein. Ultrasound image was saved for documentation. The right chest and neck were cleaned with a skin antiseptic and a sterile drape was placed. Maximal barrier sterile technique was utilized including caps, mask, sterile gowns, sterile gloves, sterile drape, hand hygiene and skin antiseptic. The right neck was anesthetized with 1% lidocaine. Small incision was made in  the right neck with a blade. Micropuncture set was placed in the right internal jugular vein with ultrasound guidance. The micropuncture wire was used for measurement purposes. The right chest was anesthetized with 1% lidocaine with epinephrine. #15 blade was used to make an incision and a subcutaneous port pocket was formed. [place] was assembled. Subcutaneous tunnel was formed with a stiff tunneling device. The port catheter was brought through the subcutaneous tunnel. The port was placed in the subcutaneous pocket. The micropuncture set was exchanged for a peel-away sheath. The catheter was placed through the peel-away sheath and the tip was positioned at the superior cavoatrial junction. Catheter placement was confirmed with fluoroscopy. The port was accessed and flushed with saline. The port pocket was closed using two layers of absorbable sutures and Dermabond. The vein skin site was closed using a single layer of absorbable suture and Dermabond. The port was accessed again. Unfortunately, the port would no longer aspirate. A new access needle was tried but again the port was not aspirating. Contrast injection confirmed that the catheter was well positioned at the superior cavoatrial junction. Fluoroscopic evaluation suggested that the port was intermittently flipping up against the vein wall. Port function was not acceptable, therefore, the  Dermabond was removed over the port insertion. The incision was opened up. The port was pulled out of the pocket. Even after some repositioning, the port was consistently flipping up against the vein wall. Therefore, the port was taken apart and the catheter was completely removed over a stiff Glidewire. A new port catheter was advanced over the wire and positioned in the right atrium. The catheter was aspirating well within the right atrium. Therefore, the port was reassembled. Port was placed back in the pocket. Pocket was closed using 2 layers of absorbable suture and Dermabond. Port was accessed again and found to aspirate and flush well. The port was flushed with normal saline. FINDINGS: Initial placement of the Port-A-Cath was at the superior cavoatrial junction. The catheter was not aspirating well at the superior cavoatrial junction. As a result, a new port was placed and positioned in the right atrium. The port aspirates and flushes well in the right atrium. IMPRESSION: Placement of a subcutaneous port device. Electronically Signed   By: Markus Daft M.D.   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 Patient complains of abdominal pain and distension. EXAM: ULTRASOUND GUIDED  PARACENTESIS MEDICATIONS: None. COMPLICATIONS: None immediate. PROCEDURE: Informed written consent was obtained from the patient after a discussion of the risks, benefits and alternatives to treatment. A timeout was performed prior to the initiation of the procedure. Initial ultrasound scanning demonstrates a large amount of ascites within the right lower abdominal quadrant. The right lower abdomen was prepped and draped in the usual sterile fashion. 1% lidocaine was used for local anesthesia. Following this, a 6 Fr Safe-T-Centesis catheter was introduced. An ultrasound image was saved for documentation purposes. The paracentesis was  performed. The catheter was removed and a dressing was applied. The patient tolerated the procedure well without immediate post procedural complication. Patient received post-procedure intravenous albumin; see nursing notes for details. FINDINGS: A total of approximately 2.9 L of red fluid was removed. IMPRESSION: Successful ultrasound-guided paracentesis yielding 2.9 liters of peritoneal fluid. Electronically Signed   By: Markus Daft M.D.   On: 09/07/2020 18:06     ***   Subjective: ***  Discharge Exam: Vitals:  September 25, 2020 0441 Sep 25, 2020 0901  BP: (!) 84/56 (!) 78/31  Pulse: (!) 113 (!) 110  Resp: 15 16  Temp: 97.7 F (36.5 C) 98 F (36.7 C)  SpO2: 99% 98%   Vitals:   09/18/20 2346 2020/09/25 0037 Sep 25, 2020 0441 09/25/2020 0901  BP: 96/65 93/60 (!) 84/56 (!) 78/31  Pulse: (!) 115 (!) 113 (!) 113 (!) 110  Resp: 17 14 15 16   Temp: 98 F (36.7 C) 97.9 F (36.6 C) 97.7 F (36.5 C) 98 F (36.7 C)  TempSrc:      SpO2: 98% 99% 99% 98%    General: Pt is alert, awake, not in acute distress*** Cardiovascular: RRR, S1/S2 +, no rubs, no gallops Respiratory: CTA bilaterally, no wheezing, no rhonchi Abdominal: Soft, NT, ND, bowel sounds + Extremities: no edema, no cyanosis    The results of significant diagnostics from this hospitalization (including imaging, microbiology, ancillary and laboratory) are listed below for reference.     Microbiology: Recent Results (from the past 240 hour(s))  Resp Panel by RT-PCR (Flu A&B, Covid) Nasopharyngeal Swab     Status: None   Collection Time: 09/18/20  6:30 AM   Specimen: Nasopharyngeal Swab; Nasopharyngeal(NP) swabs in vial transport medium  Result Value Ref Range Status   SARS Coronavirus 2 by RT PCR NEGATIVE NEGATIVE Final    Comment: (NOTE) SARS-CoV-2 target nucleic acids are NOT DETECTED.  The SARS-CoV-2 RNA is generally detectable in upper respiratory specimens during the acute phase of infection. The lowest concentration of  SARS-CoV-2 viral copies this assay can detect is 138 copies/mL. A negative result does not preclude SARS-Cov-2 infection and should not be used as the sole basis for treatment or other patient management decisions. A negative result may occur with  improper specimen collection/handling, submission of specimen other than nasopharyngeal swab, presence of viral mutation(s) within the areas targeted by this assay, and inadequate number of viral copies(<138 copies/mL). A negative result must be combined with clinical observations, patient history, and epidemiological information. The expected result is Negative.  Fact Sheet for Patients:  EntrepreneurPulse.com.au  Fact Sheet for Healthcare Providers:  IncredibleEmployment.be  This test is no t yet approved or cleared by the [place] FDA and  has been authorized for detection and/or diagnosis of SARS-CoV-2 by FDA under an Emergency Use Authorization (EUA). This EUA will remain  in effect (meaning this test can be used) for the duration of the COVID-19 declaration under Section 564(b)(1) of the Act, 21 U.S.C.section 360bbb-3(b)(1), unless the authorization is terminated  or revoked sooner.       Influenza A by PCR NEGATIVE NEGATIVE Final   Influenza B by PCR NEGATIVE NEGATIVE Final    Comment: (NOTE) The Xpert Xpress SARS-CoV-2/FLU/RSV plus assay is intended as an aid in the diagnosis of influenza from Nasopharyngeal swab specimens and should not be used as a sole basis for treatment. Nasal washings and aspirates are unacceptable for Xpert Xpress SARS-CoV-2/FLU/RSV testing.  Fact Sheet for Patients: EntrepreneurPulse.com.au  Fact Sheet for Healthcare Providers: IncredibleEmployment.be  This test is not yet approved or cleared by the [place] FDA and has been authorized for detection and/or diagnosis of SARS-CoV-2 by FDA under an Emergency Use  Authorization (EUA). This EUA will remain in effect (meaning this test can be used) for the duration of the COVID-19 declaration under Section 564(b)(1) of the Act, 21 U.S.C. section 360bbb-3(b)(1), unless the authorization is terminated or revoked.  Performed at [facility], [place] [address], [place], [place] [address]  Labs: BNP (last 3 results) No results for input(s): BNP in the last 8760 hours. Basic Metabolic Panel: Recent Labs  Lab 09/17/20 2307 Oct 16, 2020 0509  NA 124* 126*  K 6.1* >7.5*  CL 91* 95*  CO2 22 12*  GLUCOSE 156* 126*  BUN 36* 66*  CREATININE 1.16 3.06*  CALCIUM 8.4* 7.8*   Liver Function Tests: Recent Labs  Lab 09/17/20 2307  AST 24  ALT 19  ALKPHOS 55  BILITOT 0.7  PROT 6.4*  ALBUMIN 2.5*   Recent Labs  Lab 09/17/20 2307  LIPASE 47   No results for input(s): AMMONIA in the last 168 hours. CBC: Recent Labs  Lab 09/17/20 2307  WBC 11.1*  HGB 12.5*  HCT 37.1*  MCV 91.6  PLT 490*   Cardiac Enzymes: No results for input(s): CKTOTAL, CKMB, CKMBINDEX, TROPONINI in the last 168 hours. BNP: Invalid input(s): POCBNP CBG: Recent Labs  Lab 09/18/20 2348 10/16/2020 0442 10-16-2020 0824  GLUCAP 139* 124* 111*   D-Dimer No results for input(s): DDIMER in the last 72 hours. Hgb A1c No results for input(s): HGBA1C in the last 72 hours. Lipid Profile No results for input(s): CHOL, HDL, LDLCALC, TRIG, CHOLHDL, LDLDIRECT in the last 72 hours. Thyroid function studies No results for input(s): TSH, T4TOTAL, T3FREE, THYROIDAB in the last 72 hours.  Invalid input(s): FREET3 Anemia work up No results for input(s): VITAMINB12, FOLATE, FERRITIN, TIBC, IRON, RETICCTPCT in the last 72 hours. Urinalysis    Component Value Date/Time   COLORURINE YELLOW 09/18/2020 1719   APPEARANCEUR HAZY (A) 09/18/2020 1719   LABSPEC >1.046 (H) 09/18/2020 1719   PHURINE 5.0 09/18/2020 1719   GLUCOSEU NEGATIVE 09/18/2020 1719   HGBUR  NEGATIVE 09/18/2020 1719   BILIRUBINUR NEGATIVE 09/18/2020 1719   KETONESUR 5 (A) 09/18/2020 1719   PROTEINUR NEGATIVE 09/18/2020 1719   UROBILINOGEN 0.2 02/12/2015 1224   NITRITE NEGATIVE 09/18/2020 1719   LEUKOCYTESUR NEGATIVE 09/18/2020 1719   Sepsis Labs Invalid input(s): PROCALCITONIN,  WBC,  LACTICIDVEN Microbiology Recent Results (from the past 240 hour(s))  Resp Panel by RT-PCR (Flu A&B, Covid) Nasopharyngeal Swab     Status: None   Collection Time: 09/18/20  6:30 AM   Specimen: Nasopharyngeal Swab; Nasopharyngeal(NP) swabs in vial transport medium  Result Value Ref Range Status   SARS Coronavirus 2 by RT PCR NEGATIVE NEGATIVE Final    Comment: (NOTE) SARS-CoV-2 target nucleic acids are NOT DETECTED.  The SARS-CoV-2 RNA is generally detectable in upper respiratory specimens during the acute phase of infection. The lowest concentration of SARS-CoV-2 viral copies this assay can detect is 138 copies/mL. A negative result does not preclude SARS-Cov-2 infection and should not be used as the sole basis for treatment or other patient management decisions. A negative result may occur with  improper specimen collection/handling, submission of specimen other than nasopharyngeal swab, presence of viral mutation(s) within the areas targeted by this assay, and inadequate number of viral copies(<138 copies/mL). A negative result must be combined with clinical observations, patient history, and epidemiological information. The expected result is Negative.  Fact Sheet for Patients:  EntrepreneurPulse.com.au  Fact Sheet for Healthcare Providers:  IncredibleEmployment.be  This test is no t yet approved or cleared by the [place] FDA and  has been authorized for detection and/or diagnosis of SARS-CoV-2 by FDA under an Emergency Use Authorization (EUA). This EUA will remain  in effect (meaning this test can be used) for the duration of  the COVID-19 declaration under Section 564(b)(1) of the Act,  21 U.S.C.section 360bbb-3(b)(1), unless the authorization is terminated  or revoked sooner.       Influenza A by PCR NEGATIVE NEGATIVE Final   Influenza B by PCR NEGATIVE NEGATIVE Final    Comment: (NOTE) The Xpert Xpress SARS-CoV-2/FLU/RSV plus assay is intended as an aid in the diagnosis of influenza from Nasopharyngeal swab specimens and should not be used as a sole basis for treatment. Nasal washings and aspirates are unacceptable for Xpert Xpress SARS-CoV-2/FLU/RSV testing.  Fact Sheet for Patients: EntrepreneurPulse.com.au  Fact Sheet for Healthcare Providers: IncredibleEmployment.be  This test is not yet approved or cleared by the [place] FDA and has been authorized for detection and/or diagnosis of SARS-CoV-2 by FDA under an Emergency Use Authorization (EUA). This EUA will remain in effect (meaning this test can be used) for the duration of the COVID-19 declaration under Section 564(b)(1) of the Act, 21 U.S.C. section 360bbb-3(b)(1), unless the authorization is terminated or revoked.  Performed at [facility], [place] [address] [place], [place] [address]      Time coordinating discharge: Over 30 minutes***  SIGNED:   Cordelia Poche, MD [facility] 10-06-2020, 4:50 PM

## 2020-10-02 NOTE — TOC Progression Note (Addendum)
Transition of Care [facility]) - Progression Note    Patient Details  Name: Jesse Frye MRN: [id number] Date of Birth: [birth year]  Transition of Care [facility]) CM/SW [place], LCSW Phone Number: 2020/09/27, 1:44 PM  Clinical Narrative:    Patient daughter Jesse Frye states they will be able to afford the patient medications and request the medication be sent to [place] on [address].   Addendum: [id number] sent the medications to [facility] because [facility] did not carry certain medications. Daughter Jesse Frye updated.   Expected Discharge Plan: Home/Self Care    Expected Discharge Plan and Services Expected Discharge Plan: Home/Self Care In-house Referral: Hospice / [place] [facility]   Post Acute Care Choice: Hospice Living arrangements for the past 2 months: Single Family Home Expected Discharge Date:  (unknown)                                     Social Determinants of Health (SDOH) Interventions    Readmission Risk Interventions No flowsheet data found.

## 2020-10-02 NOTE — Discharge Instructions (Signed)
Cuidados para enfermos terminales Hospice Los cuidados para enfermos terminales son un servicio que brinda apoyo mdico, espiritual y psicolgico a los enfermos terminales y a Education officer, community. El objetivo es mejorar la calidad de vida de la persona mantenindola tan cmoda como sea posible en las etapas finales de la vida. Quines integran el equipo de cuidados para enfermos terminales? Las [employer] componen el equipo de cuidados para enfermos terminales:  La persona que recibe atencin y su familia.  Una enfermera y un mdico especialistas en cuidados para enfermos terminales. Tambin se puede incluir a su mdico de atencin primaria.  Un asistente social o gerente de [place].  Un lder religioso o espiritual.  Especialistas como: ? Un nutricionista. ? Un asesor en salud mental. ? Fisioterapeutas y terapeutas ocupacionales. ? Un coordinador especializado en duelo.  Voluntarios capacitados que pueden ayudar con su atencin.   Qu servicios se incluyen en los cuidados para enfermos terminales? Los servicios de cuidados para enfermos terminales pueden variar, segn la organizacin. Generalmente, estos incluyen:  Maneras de [place] cmodo, tales como: ? Engineer, manufacturing atencin en su casa o en un entorno hogareo. ? Proporcionar alivio del dolor y los sntomas. El Scientist, forensic todos los medicamentos y el equipo para que pueda estar lo suficientemente cmodo y Hydrographic surveyor como para disfrutar de la compaa de amigos y familiares. ? Trabajar con sus seres queridos para ayudarlos a Tax inspector necesidades y a Water quality scientist de su cuidado bsico. Esto lo ayuda a disfrutar de su apoyo.  Visitas o atencin de un enfermero y un mdico. Esto puede incluir servicios de guardia las 24 horas.  Visitas de otros especialistas que ofrecen servicios, por ejemplo: ? Asesoramiento para satisfacer sus necesidades emocionales, espirituales y [place], as como las necesidades de su  familia. ? Terapia de masajes. ? Terapia nutricional. ? Fisioterapia y terapia ocupacional. ? Arte o terapia musical. ? Asistencia espiritual para satisfacer sus necesidades y las de su familia. Puede incluir lo siguiente:  Ayudarlos a usted y a su familia a Armed forces technical officer de la [place].  Ayudarlo a despedirse de sus familiares y amigos.  Llevar a cabo una ceremonia o un ritual religioso especfico.  Compaa cuando est solo.  Permitirles a usted y a los miembros de su familia que descansen. Los [employer] del personal de cuidados para enfermos terminales pueden Optometrist los [employer], preparar las comidas y ocuparse de las obligaciones diarias.  Atencin hospitalaria a corto plazo, si algo no se Dealer.  Apoyo por duelo para familiares en proceso de duelo. Cundo deben comenzar los cuidados para enfermos terminales? Se cree que la [employer] de las personas que [employer] cuidados para enfermos terminales tiene menos de 50meses de vida.  Su familia y los mdicos pueden ayudarlo a decidir cundo deben [employer] servicios de cuidados para enfermos terminales.  Si vive ms de 6 meses, pero su afeccin no mejora, es posible que su mdico lo apruebe para continuar recibiendo cuidados para enfermos terminales.  Si su afeccin mejora, puede suspender el programa. Qu cosas debo tener en cuenta antes de elegir un programa? La mayora de los programas de cuidados para enfermos terminales estn a cargo de organizaciones independientes sin fines de Psychologist, prison and probation services. Algunas estn afiliadas a hospitales, hogares de ancianos o agencias de atencin Veterinary surgeon. Los programas de cuidados para enfermos terminales pueden implementarse en su hogar o en un centro de cuidados para enfermos terminales, un hospital o un centro especializado de  enfermera. Cuando elija un programa de cuidados para enfermos terminales, haga las siguientes preguntas:  Qu servicios tengo  disposicin ma y de mis seres queridos? ? Cunto cuesta? Est cubierto por el seguro mdico? ? El [place], [place] y [place], o este tiene algn otro tipo de certificacin? ? Cmo se controlarn el dolor y los sntomas? ? Proporcionarn apoyo emocional y espiritual?  Si elijo internarme en un centro de cuidados para enfermos terminales o centro de enfermera, dnde est ubicado dicho centro? Es conveniente para mis familiares y amigos? ? Si elijo internarme en un centro de cuidados para enfermos terminales o centro de enfermera, podrn mis familiares y amigos visitarme en cualquier momento? ? Si mi situacin cambia, los servicios pueden brindarse en un entorno diferente, por ejemplo, en mi casa o en el hospital?  Quines integran el equipo de cuidados para enfermos terminales? Cmo se los capacita o selecciona? ? Qu participacin pueden tener mis seres queridos? ? A quin pueden llamar mis familiares si tienen preguntas? ? Qu participacin tiene mi mdico? Dnde puedo obtener ms informacin acerca de los cuidados para enfermos terminales? Los mdicos pueden brindarle informacin acerca de los programas de cuidados para enfermos terminales que existen en su zona. Los sitios web de las siguientes organizaciones tienen informacin til:  [employer] and Palliative Care Organization ([place] de Cuidados para Enfermos Terminales y Web designer): http://www.brown-buchanan.com/  Geophysical data processor for [place] ([place] de Atencin Domiciliaria y Cuidados para Enfermos Terminales): http://massey-hart.com/  Hospice Foundation of America (Fundacin Estadounidense de Cuidados para Enfermos Terminales): www.hospicefoundation.org  American Cancer Society (Sociedad Estadounidense contra el Cncer): www.cancer.org  eHospice: ehospice.com Estos organismos tambin pueden proporcionar informacin:  Una agencia local sobre el envejecimiento.  Su  oficina local de [employer].  El departamento de salud o servicios sociales de su [place]. Resumen  Los cuidados para enfermos terminales son un servicio que brinda apoyo mdico, espiritual y psicolgico a los enfermos terminales y a Education officer, community.  El objetivo de los cuidados para enfermos terminales es mejorar su calidad de vida mantenindolo tan cmodo como sea posible en las etapas finales de la vida.  Generalmente, los equipos de cuidados para enfermos terminales incluyen un mdico, enfermero, trabajador social, consejero, lder religioso o espiritual, nutricionista, terapeutas y voluntarios.  Los cuidados para enfermos terminales generalmente incluyen gestin del dolor, visitas de mdicos y enfermeros, terapia fsica y ocupacional, asesoramiento nutricional, asesoramiento espiritual y Architectural technologist, 30 de cuidadores y 70 por duelo para los familiares en proceso de duelo.  Los programas de cuidados para enfermos terminales pueden implementarse en su hogar o en un centro de cuidados para enfermos terminales, un hospital o un centro especializado de enfermera. Esta informacin no tiene Marine scientist el consejo del mdico. Asegrese de hacerle al mdico cualquier pregunta que tenga. Document Revised: 06/29/2020 Document Reviewed: 06/29/2020 Elsevier Patient Education  2021 [employer].

## 2020-10-02 NOTE — Progress Notes (Signed)
While making rounds on unit a pt's family member saw the Chaplain was there and asked for a visit.  Chaplain entered the pt's room to find wife and 3 other family members there, some who had come from [place] to visit.   Chaplain offered and wife accepted an anointing using the Ramsey rite as we all knelt by pt's bedside.  [place]

## 2020-10-02 NOTE — Progress Notes (Addendum)
Post mortem care completed. Post mortem flowsheet filled out, bed placement notified. Transporter called, AC notified. Pt belongings placed in belongings bag with patient label and will be sent down with body, per family request.

## 2020-10-02 NOTE — Progress Notes (Addendum)
Manufacturing engineer [facility]) Hospital Liaison Note:  Referral received for hospice services at home once discharged.  Spoke with patient daughter to confirm hospice interest, explain services and offered support. [facility] MD is reviewing for hospice eligibility at this time.   Plan is to discharge home possibly today.   Currently has no DME needs.   Please  send completed DNR and arrange for any comfort scripts that may be needed for symptom management so there is no lapse in patient comfort prior to hospice services beginning.   Gar Ponto, RN [facility] Liaison (in [phone]    UPDATE:  Patient is hospice eligible per [facility] MD

## 2020-10-02 NOTE — Progress Notes (Signed)
Patient deceased at 76, two RN's pronounced, Dr. Lonny Prude made aware

## 2020-10-02 DEATH — deceased

## 2022-02-24 IMAGING — US IR PARACENTESIS
1 series · 4 of 4 positions shown · non-contrast
Comparison: none

INDICATION: 64-year-old with a pancreatic mass, peritoneal disease and ascites.
Patient complains of abdominal pain and distension.

[Series 1: (id) · 4 of 4 slices shown]
[im 1/4]
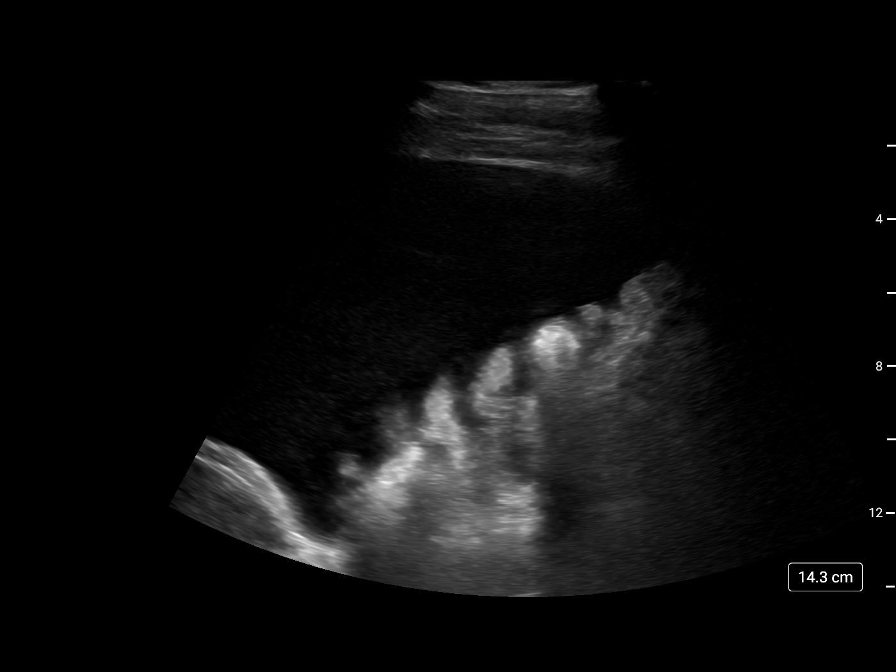
[im 2/4]
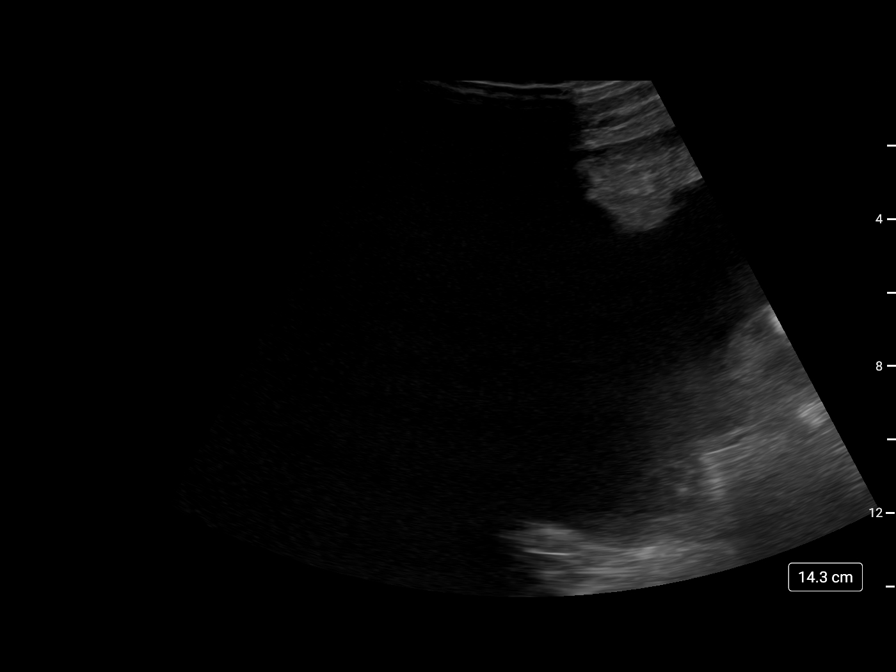
[im 3/4]
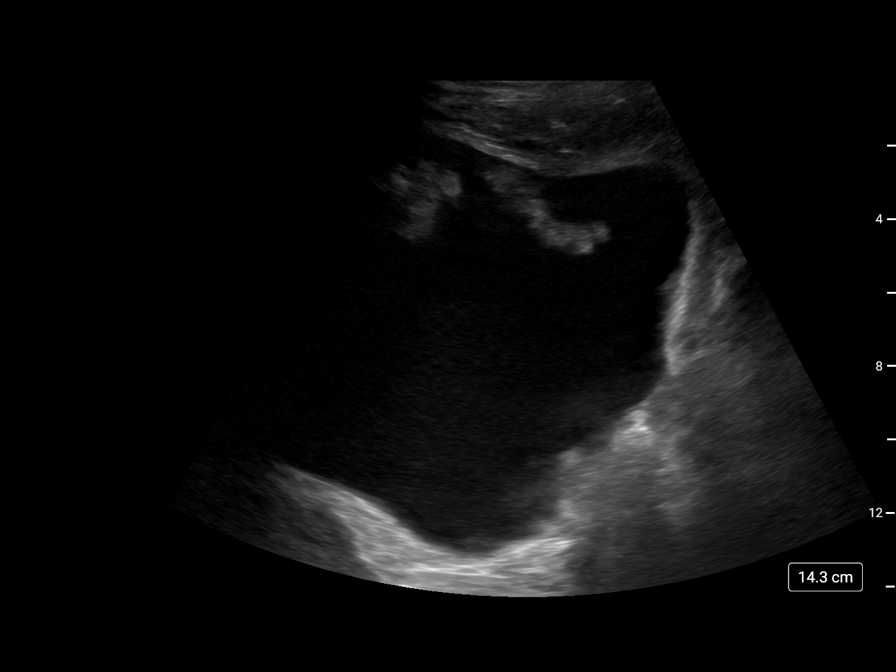
[im 4/4]
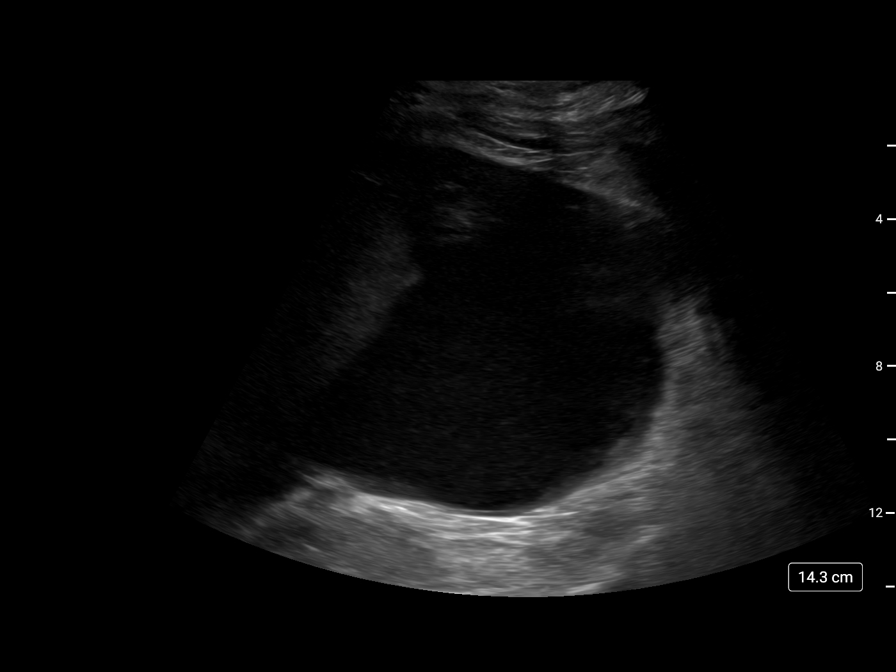

[4 of 4 positions shown; findings below may reference images not displayed]

EXAM:
ULTRASOUND GUIDED  PARACENTESIS

MEDICATIONS:
None.

COMPLICATIONS:
None immediate.

PROCEDURE:
Informed written consent was obtained from the patient after a
discussion of the risks, benefits and alternatives to treatment. A
timeout was performed prior to the initiation of the procedure.

Initial ultrasound scanning demonstrates a large amount of ascites
within the right lower abdominal quadrant. The right lower abdomen
was prepped and draped in the usual sterile fashion. 1% lidocaine
was used for local anesthesia.

Following this, a 6 Fr Safe-T-Centesis catheter was introduced. An
ultrasound image was saved for documentation purposes. The
paracentesis was performed. The catheter was removed and a dressing
was applied. The patient tolerated the procedure well without
immediate post procedural complication.
Patient received post-procedure intravenous albumin; see nursing
notes for details.
FINDINGS: A total of approximately 2.9 L of red fluid was removed.
IMPRESSION: Successful ultrasound-guided paracentesis yielding 2.9 liters of
peritoneal fluid.

## 2022-02-24 IMAGING — CT CT CHEST W/ CM
2 of 3 series · 15 of 36 positions shown, 18 images · IV contrast (omnipaque)
Comparison: Abdomen CT on 08/21/2020; no prior chest CT

CLINICAL DATA: Pancreatic carcinoma.

EXAM:
CT CHEST WITH CONTRAST
TECHNIQUE: Multidetector CT imaging of the chest was performed during
intravenous contrast administration.
CONTRAST:  75mL OMNIPAQUE IOHEXOL 300 MG/ML  SOLN

[Series 2: axial st · axial · 0.65mm/px · z∈[+1337,+1575]mm · 12 of 141 slices shown, 15 images]
[im 11/141  mediastinal]
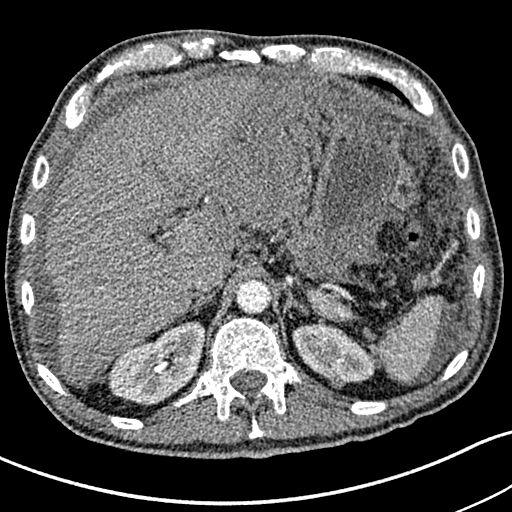
[im 11/141  lung]
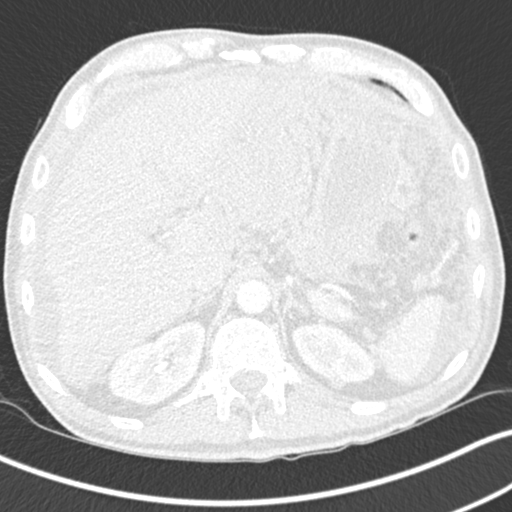
[im 21/141  lung]
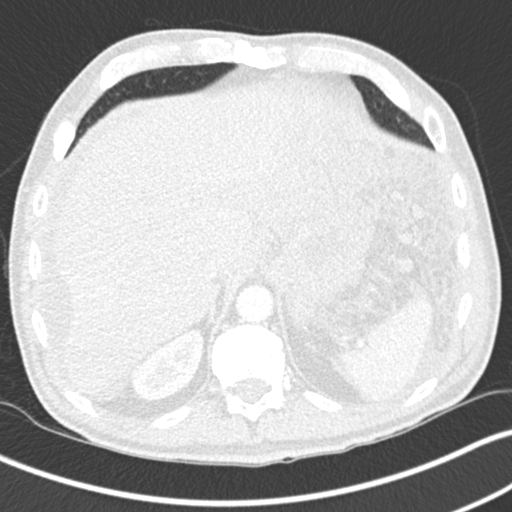
[im 32/141  lung]
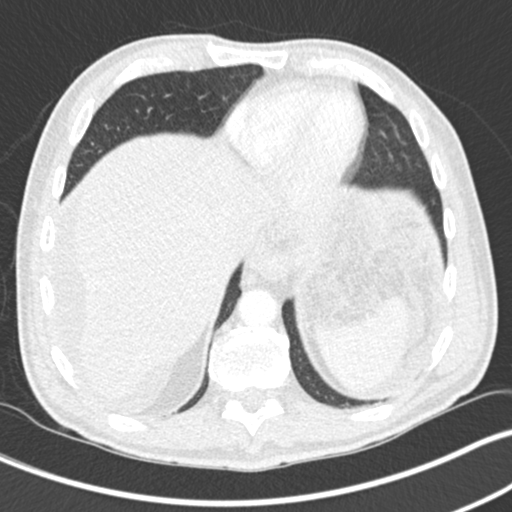
[im 42/141  lung]
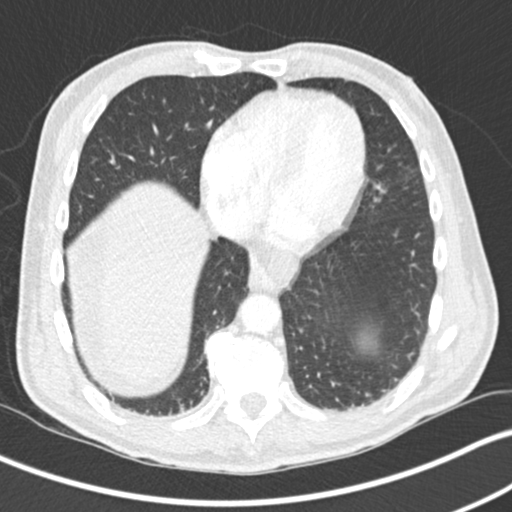
[im 52/141  mediastinal]
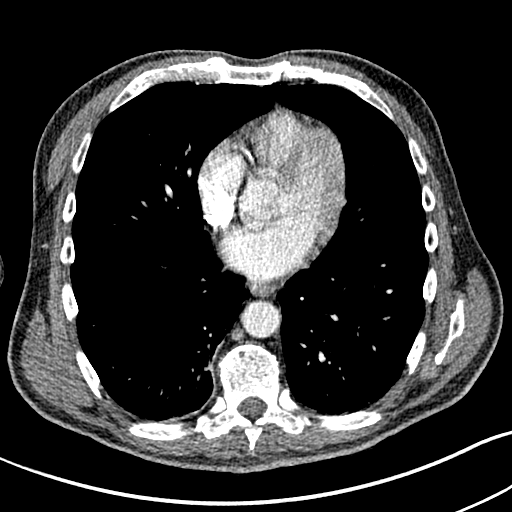
[im 52/141  lung]
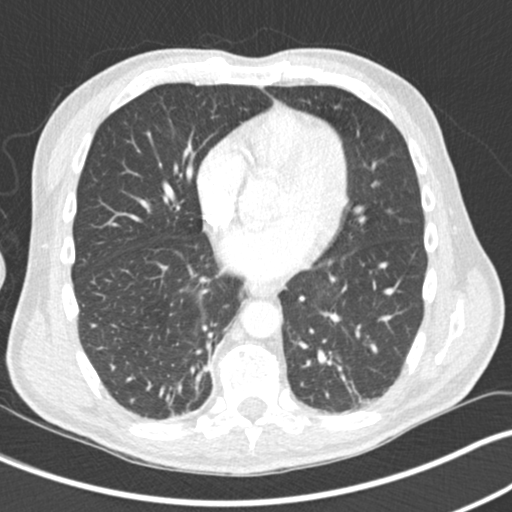
[im 63/141  lung]
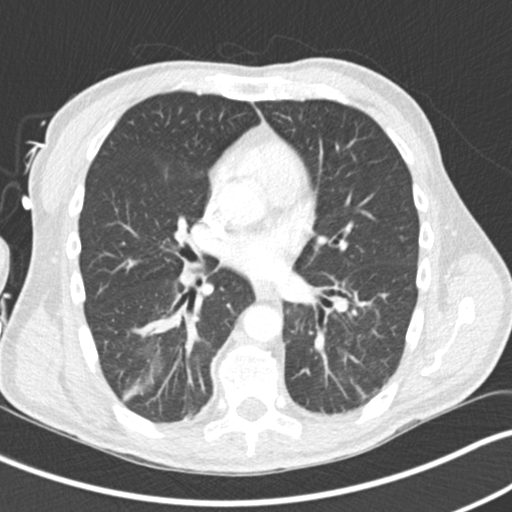
[im 78/141  lung]
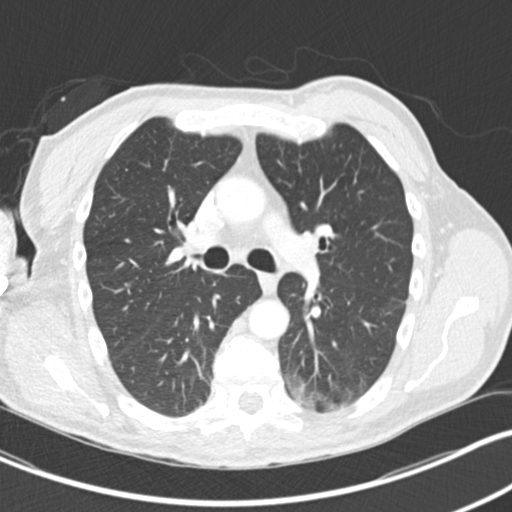
[im 89/141  lung]
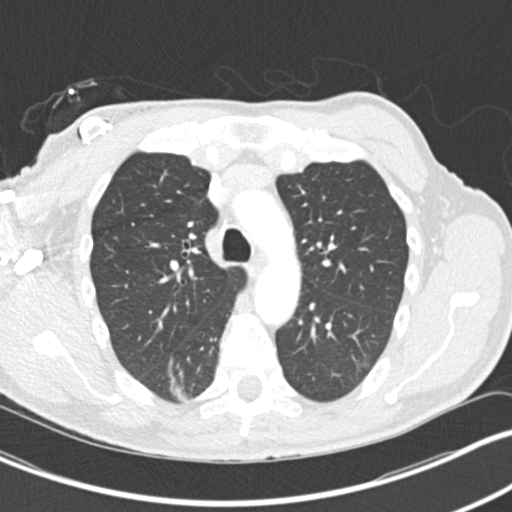
[im 99/141  mediastinal]
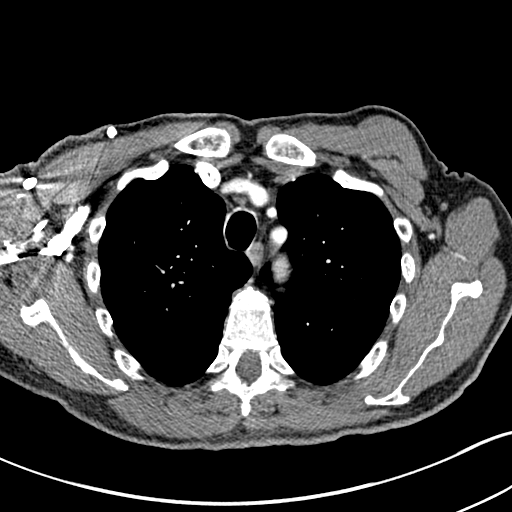
[im 99/141  lung]
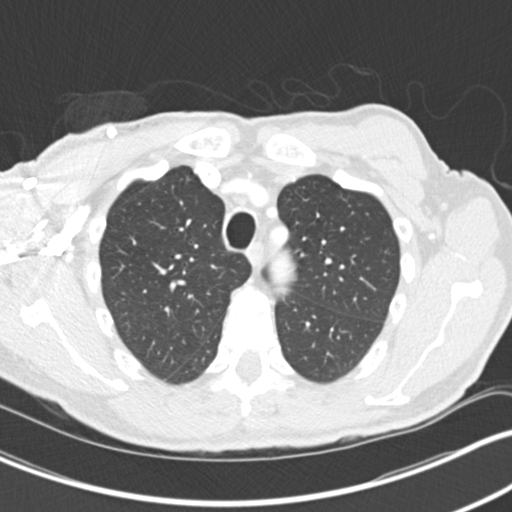
[im 109/141  lung]
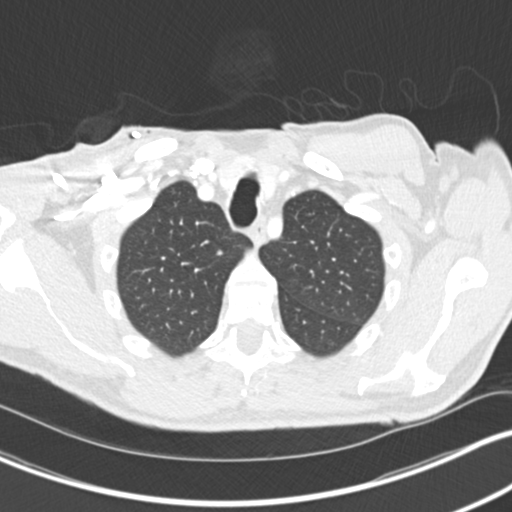
[im 120/141  lung]
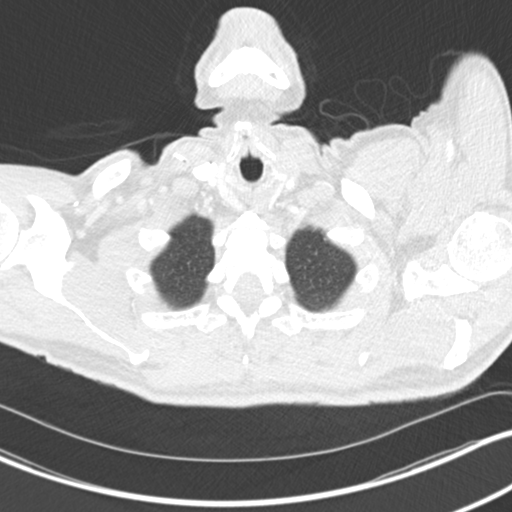
[im 130/141  lung]
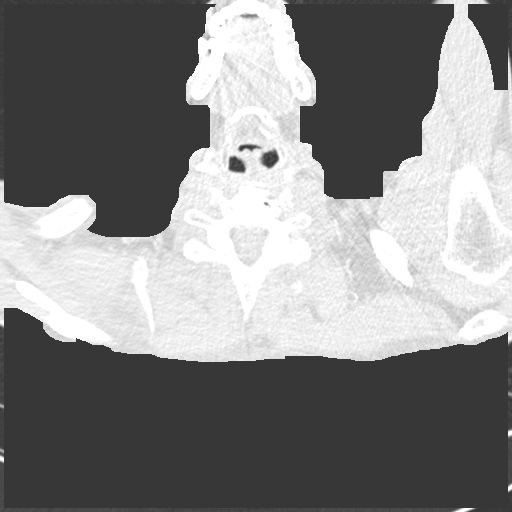

[Series 6: coronal · coronal · 0.58mm/px · 3 of 144 slices shown]
[im 29/144  lung]
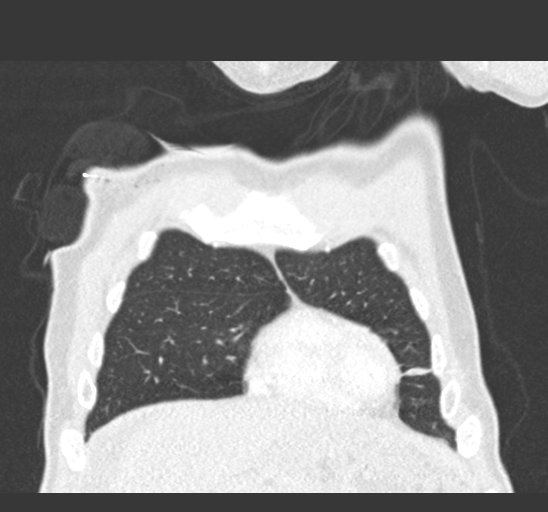
[im 58/144  lung]
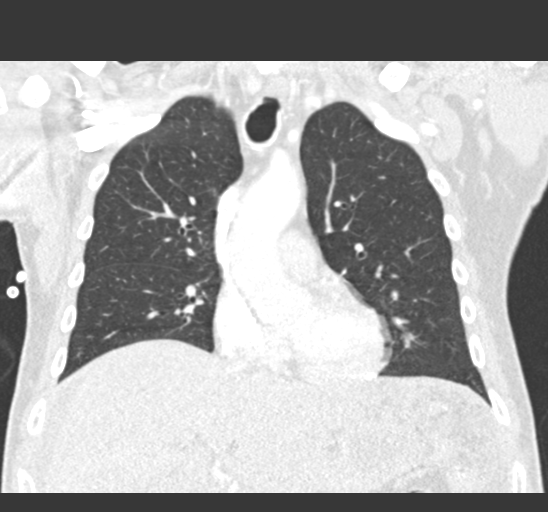
[im 86/144  lung]
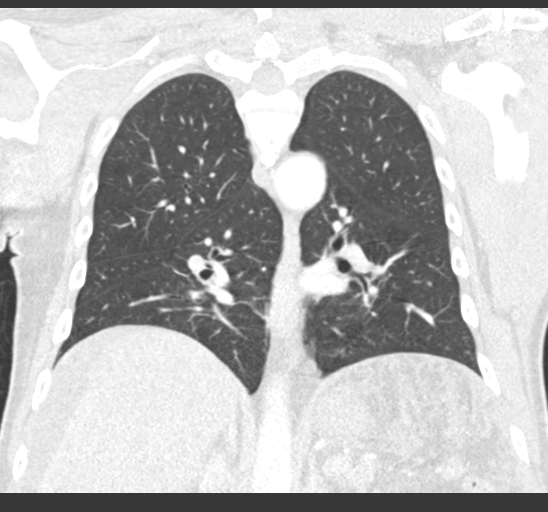

[15 of 36 positions shown; findings below may reference images not displayed]

FINDINGS: Cardiovascular: No acute findings. Aortic atherosclerotic
calcification noted.

Mediastinum/Nodes: No masses or pathologically enlarged lymph nodes
identified.

Lungs/Pleura: Linear opacity in both lower lobes is consistent with
subsegmental atelectasis. No evidence of pulmonary infiltrate or
pleural effusion. A 3 mm pulmonary nodule is seen in the medial
right upper lobe on image 33/5, and a 3 mm nodule is seen in the
left lung apex on image [DATE], which are nonspecific but relatively
unlikely to represent metastatic disease.

Upper Abdomen: Large pancreatic soft tissue mass in the central
upper abdomen has increased since previous study. There is new
perihepatic ascites and diffuse omental soft tissue stranding,
consistent with worsening peritoneal carcinomatosis.

Musculoskeletal:  No suspicious bone lesions.
IMPRESSION: No definite metastatic disease within the thorax. 2 tiny bilateral
upper lobe pulmonary nodules are nonspecific, but unlikely represent
metastatic disease. Recommend continued attention on follow-up
imaging.

Bilateral lower lobe subsegmental atelectasis.

Increased size of large pancreatic soft tissue mass and worsening
peritoneal carcinomatosis and perihepatic ascites.

Aortic Atherosclerosis ([id number]).

## 2023-04-01 ENCOUNTER — Other Ambulatory Visit: Payer: Self-pay
